# Patient Record
Sex: Female | Born: 1955 | Race: Black or African American | Hispanic: No | Marital: Single | Smoking: Former smoker
Health system: Southern US, Community
[De-identification: ages and names within clinical notes are randomized; demographics above are authoritative.]

## PROBLEM LIST (undated history)

## (undated) DIAGNOSIS — Z720 Tobacco use: Secondary | ICD-10-CM

## (undated) DIAGNOSIS — J189 Pneumonia, unspecified organism: Secondary | ICD-10-CM

## (undated) DIAGNOSIS — E119 Type 2 diabetes mellitus without complications: Secondary | ICD-10-CM

## (undated) DIAGNOSIS — J449 Chronic obstructive pulmonary disease, unspecified: Secondary | ICD-10-CM

## (undated) DIAGNOSIS — I10 Essential (primary) hypertension: Secondary | ICD-10-CM

---

## 2000-09-08 ENCOUNTER — Other Ambulatory Visit: Admission: RE | Admit: 2000-09-08 | Discharge: 2000-09-08 | Payer: Self-pay | Admitting: Obstetrics and Gynecology

## 2000-09-19 ENCOUNTER — Ambulatory Visit (HOSPITAL_COMMUNITY): Admission: RE | Admit: 2000-09-19 | Discharge: 2000-09-19 | Payer: Self-pay | Admitting: *Deleted

## 2000-09-19 ENCOUNTER — Encounter: Payer: Self-pay | Admitting: Obstetrics and Gynecology

## 2000-11-05 ENCOUNTER — Emergency Department (HOSPITAL_COMMUNITY): Admission: EM | Admit: 2000-11-05 | Discharge: 2000-11-06 | Payer: Self-pay | Admitting: *Deleted

## 2000-11-06 ENCOUNTER — Encounter: Payer: Self-pay | Admitting: *Deleted

## 2000-11-06 ENCOUNTER — Ambulatory Visit (HOSPITAL_COMMUNITY): Admission: RE | Admit: 2000-11-06 | Discharge: 2000-11-06 | Payer: Self-pay | Admitting: *Deleted

## 2000-11-14 ENCOUNTER — Ambulatory Visit (HOSPITAL_COMMUNITY): Admission: RE | Admit: 2000-11-14 | Discharge: 2000-11-14 | Payer: Self-pay | Admitting: Family Medicine

## 2000-11-14 ENCOUNTER — Encounter: Payer: Self-pay | Admitting: Family Medicine

## 2002-05-25 ENCOUNTER — Ambulatory Visit (HOSPITAL_COMMUNITY): Admission: RE | Admit: 2002-05-25 | Discharge: 2002-05-25 | Payer: Self-pay | Admitting: General Surgery

## 2003-12-16 ENCOUNTER — Ambulatory Visit (HOSPITAL_COMMUNITY): Admission: RE | Admit: 2003-12-16 | Discharge: 2003-12-16 | Payer: Self-pay | Admitting: Obstetrics and Gynecology

## 2005-02-11 ENCOUNTER — Ambulatory Visit (HOSPITAL_COMMUNITY): Admission: RE | Admit: 2005-02-11 | Discharge: 2005-02-11 | Payer: Self-pay | Admitting: Obstetrics and Gynecology

## 2006-03-24 ENCOUNTER — Ambulatory Visit (HOSPITAL_COMMUNITY): Admission: RE | Admit: 2006-03-24 | Discharge: 2006-03-24 | Payer: Self-pay | Admitting: Family Medicine

## 2007-01-18 ENCOUNTER — Emergency Department (HOSPITAL_COMMUNITY): Admission: EM | Admit: 2007-01-18 | Discharge: 2007-01-18 | Payer: Self-pay | Admitting: Emergency Medicine

## 2007-01-20 ENCOUNTER — Emergency Department (HOSPITAL_COMMUNITY): Admission: EM | Admit: 2007-01-20 | Discharge: 2007-01-20 | Payer: Self-pay | Admitting: Emergency Medicine

## 2007-04-02 ENCOUNTER — Emergency Department (HOSPITAL_COMMUNITY): Admission: EM | Admit: 2007-04-02 | Discharge: 2007-04-02 | Payer: Self-pay | Admitting: Emergency Medicine

## 2009-07-25 ENCOUNTER — Ambulatory Visit (HOSPITAL_COMMUNITY): Admission: RE | Admit: 2009-07-25 | Discharge: 2009-07-25 | Payer: Self-pay | Admitting: Obstetrics & Gynecology

## 2010-10-12 NOTE — H&P (Signed)
   Rita Jackson, Rita Jackson                         ACCOUNT NO.:  [id number]   MEDICAL RECORD NO.:  [id number]                   PATIENT TYPE:   LOCATION:                                       FACILITY:  [facility]   PHYSICIAN:  Dirk Dress. Katrinka Blazing, M.D.                DATE OF BIRTH:   DATE OF ADMISSION:  DATE OF DISCHARGE:                                HISTORY & PHYSICAL   HISTORY OF PRESENT ILLNESS:  [age]-year-old female with a history of a  mass of her left hip that has been present for about eight months.  The mass  is gradually increasing in size.  It is not painful.  She has some  discomfort with sitting.  There has been no change in color of the mass.  The patient is scheduled for have the mass removed in the minor procedure  room at the hospital.   PAST HISTORY:  The patient has no chronic medical illnesses.   PAST SURGICAL HISTORY:  The patient has had no surgeries.  No  hospitalizations.   ALLERGIES:  No known drug allergies.   SOCIAL HISTORY:  The patient does not drink, smoke or use drugs.   REVIEW OF SYSTEMS:  Review of systems is entirely negative.   PHYSICAL EXAMINATION:  VITAL SIGNS:  Blood pressure 120/80, pulse 64,  respirations 18 and weight 155 pounds.  HEENT:  No abnormality noted.  NECK:  Supple without JVD or bruits.  CHEST:  Clear to auscultation.  HEART:  Regular rate and rhythm without murmur, gallop or rub.  ABDOMEN:  Soft, nontender and no masses.  EXTREMITIES:  No cyanosis, clubbing or edema.  BACK:  Reveals a 4 cm mass of the left upper buttock and lower back.  It is  slightly fixed and probably inflammatory.  There is some skin dimpling over  the mass.  No other lesions are noted.  NEUROLOGIC EXAMINATION:  Nonfocal.   IMPRESSION:  Soft tissue mass, left upper buttock.   PLAN:  Excision under local anesthesia in the minor procedure area.                                                 Dirk Dress. Katrinka Blazing, M.D.   LCS/MEDQ  D:  05/25/2002  T:   05/25/2002  Job:  [id number]

## 2010-10-12 NOTE — Op Note (Signed)
   NAME:  Rita Jackson, Rita Jackson                       ACCOUNT NO.:  [id number]   MEDICAL RECORD NO.:  [id number]                   PATIENT TYPE:  AMB   LOCATION:  DAY                                  FACILITY:  [facility]   PHYSICIAN:  Jerolyn Shin C. Katrinka Blazing, M.D.                DATE OF BIRTH:  May 23, 1956   DATE OF PROCEDURE:  DATE OF DISCHARGE:                                 OPERATIVE REPORT   PREOPERATIVE DIAGNOSIS:  Mass left buttock.   POSTOPERATIVE DIAGNOSIS:  Mass left buttock, 5 cm.   PROCEDURE:  Excision of mass left buttock, 5 cm.   SURGEON:  Dirk Dress. Katrinka Blazing, M.D.   DESCRIPTION OF PROCEDURE:  The patient was taken to the minor procedure room  in Day Surgery.  Her left buttock was prepped and draped in a sterile field.  Local infiltration with 1% Xylocaine mixed 1:1 with 0.5% Marcaine was  carried out.  An elliptical incision was made over the mass. The skin  overlying the mass was excised.  A soft tissue, pliable, dark, mass was  encountered.  It was dissected with electrocautery and Metzenbaum scissors.  It was totally dissected and sent for pathologic evaluation.   The deep tissues were closed with 2-0 Biosyn.  Subcutaneous tissue closed  with 3-0 Biosyn.  Skin closed with 3-0 Prolene.  Sterile dressing was  placed.  The patient tolerated the procedure.  She was allowed to dress and  was taken back to Day Surgery area for discharge home.   DISCHARGE MEDICATIONS:  Lortab 7.5 mg 1-2 q.4h. as needed for pain.   DISCHARGE INSTRUCTIONS:  I will see her back in the office in 1 week.                                               Dirk Dress. Katrinka Blazing, M.D.    LCS/MEDQ  D:  05/25/2002  T:  05/25/2002  Job:  [id number]

## 2011-07-30 ENCOUNTER — Emergency Department (HOSPITAL_COMMUNITY)
Admission: EM | Admit: 2011-07-30 | Discharge: 2011-07-30 | Disposition: A | Discharge status: Home Health | Payer: BC Managed Care – PPO | Attending: Emergency Medicine | Admitting: Emergency Medicine

## 2011-07-30 ENCOUNTER — Emergency Department (HOSPITAL_COMMUNITY): Payer: BC Managed Care – PPO

## 2011-07-30 ENCOUNTER — Encounter (HOSPITAL_COMMUNITY): Payer: Self-pay | Admitting: *Deleted

## 2011-07-30 DIAGNOSIS — M549 Dorsalgia, unspecified: Secondary | ICD-10-CM | POA: Insufficient documentation

## 2011-07-30 DIAGNOSIS — R221 Localized swelling, mass and lump, neck: Secondary | ICD-10-CM | POA: Insufficient documentation

## 2011-07-30 DIAGNOSIS — R5383 Other fatigue: Secondary | ICD-10-CM | POA: Insufficient documentation

## 2011-07-30 DIAGNOSIS — R51 Headache: Secondary | ICD-10-CM | POA: Insufficient documentation

## 2011-07-30 DIAGNOSIS — R059 Cough, unspecified: Secondary | ICD-10-CM | POA: Insufficient documentation

## 2011-07-30 DIAGNOSIS — R079 Chest pain, unspecified: Secondary | ICD-10-CM | POA: Insufficient documentation

## 2011-07-30 DIAGNOSIS — J3489 Other specified disorders of nose and nasal sinuses: Secondary | ICD-10-CM | POA: Insufficient documentation

## 2011-07-30 DIAGNOSIS — IMO0001 Reserved for inherently not codable concepts without codable children: Secondary | ICD-10-CM | POA: Insufficient documentation

## 2011-07-30 DIAGNOSIS — J40 Bronchitis, not specified as acute or chronic: Secondary | ICD-10-CM | POA: Insufficient documentation

## 2011-07-30 DIAGNOSIS — R5381 Other malaise: Secondary | ICD-10-CM | POA: Insufficient documentation

## 2011-07-30 DIAGNOSIS — R05 Cough: Secondary | ICD-10-CM | POA: Insufficient documentation

## 2011-07-30 DIAGNOSIS — R220 Localized swelling, mass and lump, head: Secondary | ICD-10-CM | POA: Insufficient documentation

## 2011-07-30 DIAGNOSIS — F17200 Nicotine dependence, unspecified, uncomplicated: Secondary | ICD-10-CM | POA: Insufficient documentation

## 2011-07-30 DIAGNOSIS — R062 Wheezing: Secondary | ICD-10-CM | POA: Insufficient documentation

## 2011-07-30 MED ORDER — ALBUTEROL SULFATE HFA 108 (90 BASE) MCG/ACT IN AERS
2.0000 | INHALATION_SPRAY | Freq: Once | RESPIRATORY_TRACT | Status: AC
  Administered 2011-07-30: 2 via RESPIRATORY_TRACT
  Filled 2011-07-30: qty 6.7

## 2011-07-30 MED ORDER — AZITHROMYCIN 250 MG PO TABS: ORAL_TABLET | ORAL | Status: DC

## 2011-07-30 MED ORDER — NAPROXEN 500 MG PO TABS: 500.0000 mg | ORAL_TABLET | Freq: Two times a day (BID) | ORAL | Status: AC

## 2011-07-30 NOTE — ED Notes (Signed)
Pt Dc to home with steady gait. 

## 2011-07-30 NOTE — ED Provider Notes (Signed)
History     CSN: [id number]  Arrival date & time 07/30/11  1205   First MD Initiated Contact with Patient 07/30/11 1243      Chief Complaint  Patient presents with  . Back Pain    (Consider location/radiation/quality/duration/timing/severity/associated sxs/prior treatment) HPI Comments: Patient complains of productive cough, nasal congestion, upper back pain and generalized weakness for several days.  She states the upper back pain is worse with excessive coughing.  She denies shortness of breath, chest pain, or fever. She does report occasional wheezing  Patient is a 56 y.o. female presenting with cough. The history is provided by the patient. No language interpreter was used.  Cough This is a new problem. The current episode started more than 2 days ago. The problem occurs every few minutes. The problem has not changed since onset.The cough is productive of sputum. There has been no fever. Associated symptoms include headaches, rhinorrhea, sore throat and wheezing. Pertinent negatives include no chest pain, no chills, no sweats, no ear congestion, no myalgias and no shortness of breath. She has tried nothing for the symptoms. The treatment provided mild relief. She is a smoker. Her past medical history is significant for bronchitis. Her past medical history does not include pneumonia or asthma.    History reviewed. No pertinent past medical history.  History reviewed. No pertinent past surgical history.  History reviewed. No pertinent family history.  History  Substance Use Topics  . Smoking status: Current Everyday Smoker  . Smokeless tobacco: Not on file  . Alcohol Use: No    OB History    Grav Para Term Preterm Abortions TAB SAB Ect Mult Living                  Review of Systems  Constitutional: Negative for fever and chills.  HENT: Positive for congestion, sore throat and rhinorrhea. Negative for trouble swallowing, neck pain and neck stiffness.   Respiratory:  Positive for cough and wheezing. Negative for chest tightness and shortness of breath.   Cardiovascular: Negative for chest pain, palpitations and leg swelling.  Gastrointestinal: Negative for nausea, vomiting and abdominal pain.  Genitourinary: Negative for dysuria, hematuria and flank pain.  Musculoskeletal: Positive for back pain. Negative for myalgias and arthralgias.  Skin: Negative.  Negative for rash.  Neurological: Positive for headaches. Negative for dizziness, syncope, weakness and numbness.  Hematological: Does not bruise/bleed easily.  All other systems reviewed and are negative.    Allergies  Review of patient's allergies indicates no known allergies.  Home Medications  No current outpatient prescriptions on file.  BP 115/64  Pulse 83  Temp(Src) 99 F (37.2 C) (Oral)  Resp 20  Ht 5\' 7"  (1.702 m)  Wt 160 lb (72.576 kg)  BMI 25.06 kg/m2  SpO2 98%  Physical Exam  Nursing note and vitals reviewed. Constitutional: She is oriented to person, place, and time. She appears well-developed and well-nourished. No distress.  HENT:  Head: Normocephalic and atraumatic. No trismus in the jaw.  Right Ear: Tympanic membrane and ear canal normal.  Left Ear: Tympanic membrane and ear canal normal.  Nose: Mucosal edema and rhinorrhea present.  Mouth/Throat: Uvula is midline and mucous membranes are normal. No uvula swelling. Posterior oropharyngeal erythema present. No oropharyngeal exudate, posterior oropharyngeal edema or tonsillar abscesses.  Neck: Normal range of motion. Neck supple. No Brudzinski's sign and no Kernig's sign noted.  Cardiovascular: Normal rate, regular rhythm, normal heart sounds and intact distal pulses.   No murmur heard. Pulmonary/Chest:  Effort normal. No respiratory distress. She has no wheezes. She has no rales. She exhibits no tenderness.       Coarse lung sounds bilaterally  Abdominal: Soft. She exhibits no distension. There is no tenderness.    Musculoskeletal: Normal range of motion. She exhibits tenderness. She exhibits no edema.  Lymphadenopathy:    She has no cervical adenopathy.  Neurological: She is alert and oriented to person, place, and time. She exhibits normal muscle tone. Coordination normal.  Skin: Skin is warm and dry.    ED Course  Procedures (including critical care time)  Labs Reviewed - No data to display Dg Chest 2 View  07/30/2011  *RADIOLOGY REPORT*  Clinical Data: Left posterior chest and back pain  CHEST - 2 VIEW  Comparison: None.  Findings: No active infiltrate or effusion is seen.  The lungs are slightly hyperaerated.  There is peribronchial thickening as well, findings consistent with mild COPD and probable chronic bronchitis. Mild cardiomegaly is noted.  No effusion is seen.  No bony abnormality is noted.  IMPRESSION: Probable COPD and chronic bronchitis.  No active process.  Mild cardiomegaly.  Original Report Authenticated By: Juline Patch, M.D.        MDM     Patient is resting comfortably no acute distress. She is nontoxic appearing, ambulates well. Few coarse lung sounds bilaterally no rales or wheezing.  Vital signs are stable, no hypoxia tachypnea or tachycardia to suggest PE. No chest pain, dyspnea, or hypotension.   I have counseled the patient on smoking cessation, I will prescribe antibiotics and albuterol inhaler. She agrees to close followup with her primary care physician Dr.Hill or to return to the ER for any worsening symptoms.  Patient / Family / Caregiver understand and agree with initial ED impression and plan with expectations set for ED visit. Pt stable in ED with no significant deterioration in condition. Pt feels improved after observation and/or treatment in ED.     Kendal Raffo L. [place], [place] 08/01/11 1810

## 2011-07-30 NOTE — Discharge Instructions (Signed)

## 2011-07-30 NOTE — ED Notes (Signed)
Upper back pain ,and feels weak,  Cough, headache

## 2011-08-03 NOTE — ED Provider Notes (Signed)
Medical screening examination/treatment/procedure(s) were performed by non-physician practitioner and as supervising physician I was immediately available for consultation/collaboration.   Berneice Zettlemoyer M Lamiracle Chaidez, DO 08/03/11 0714 

## 2013-02-25 ENCOUNTER — Encounter: Payer: Self-pay | Admitting: *Deleted

## 2013-02-25 ENCOUNTER — Other Ambulatory Visit: Payer: Self-pay | Admitting: Obstetrics & Gynecology

## 2013-06-21 ENCOUNTER — Other Ambulatory Visit (HOSPITAL_COMMUNITY): Payer: Self-pay | Admitting: Family Medicine

## 2013-06-21 DIAGNOSIS — Z139 Encounter for screening, unspecified: Secondary | ICD-10-CM

## 2013-06-24 ENCOUNTER — Ambulatory Visit (HOSPITAL_COMMUNITY)
Admission: RE | Admit: 2013-06-24 | Discharge: 2013-06-24 | Disposition: A | Discharge status: Home / Self Care | Payer: BC Managed Care – PPO | Source: Ambulatory Visit | Attending: Family Medicine | Admitting: Family Medicine

## 2013-06-24 DIAGNOSIS — Z1231 Encounter for screening mammogram for malignant neoplasm of breast: Secondary | ICD-10-CM | POA: Insufficient documentation

## 2013-06-24 DIAGNOSIS — Z139 Encounter for screening, unspecified: Secondary | ICD-10-CM

## 2013-07-02 ENCOUNTER — Other Ambulatory Visit: Payer: Self-pay | Admitting: Obstetrics & Gynecology

## 2013-07-10 ENCOUNTER — Emergency Department (INDEPENDENT_AMBULATORY_CARE_PROVIDER_SITE_OTHER): Payer: BC Managed Care – PPO

## 2013-07-10 ENCOUNTER — Emergency Department (HOSPITAL_COMMUNITY)
Admission: EM | Admit: 2013-07-10 | Discharge: 2013-07-10 | Disposition: A | Discharge status: Home / Self Care | Payer: BC Managed Care – PPO | Source: Home / Self Care | Attending: Family Medicine | Admitting: Family Medicine

## 2013-07-10 ENCOUNTER — Encounter (HOSPITAL_COMMUNITY): Payer: Self-pay | Admitting: Emergency Medicine

## 2013-07-10 DIAGNOSIS — J209 Acute bronchitis, unspecified: Secondary | ICD-10-CM

## 2013-07-10 DIAGNOSIS — J440 Chronic obstructive pulmonary disease with acute lower respiratory infection: Secondary | ICD-10-CM

## 2013-07-10 MED ORDER — DEXTROMETHORPHAN POLISTIREX 30 MG/5ML PO LQCR: 60.0000 mg | Freq: Two times a day (BID) | ORAL | Status: DC

## 2013-07-10 MED ORDER — LEVOFLOXACIN 500 MG PO TABS: 500.0000 mg | ORAL_TABLET | Freq: Every day | ORAL | Status: DC

## 2013-07-10 NOTE — ED Notes (Signed)
C/o cough , St for 1 week. Smokes 1-2 pk /day

## 2013-07-10 NOTE — ED Provider Notes (Signed)
CSN: [id number]     Arrival date & time 07/10/13  1230 History   First MD Initiated Contact with Patient 07/10/13 1310     Chief Complaint  Patient presents with  . Cough     (Consider location/radiation/quality/duration/timing/severity/associated sxs/prior Treatment) Patient is a 58 y.o. female presenting with cough. The history is provided by the patient.  Cough Cough characteristics:  Productive and harsh Sputum characteristics:  Green Severity:  Moderate Onset quality:  Sudden Duration:  1 week Progression:  Unchanged Chronicity:  New Smoker: yes   Associated symptoms: rhinorrhea and shortness of breath   Associated symptoms: no fever     History reviewed. No pertinent past medical history. History reviewed. No pertinent past surgical history. History reviewed. No pertinent family history. History  Substance Use Topics  . Smoking status: Current Every Day Smoker  . Smokeless tobacco: Not on file  . Alcohol Use: No   OB History   Grav Para Term Preterm Abortions TAB SAB Ect Mult Living                 Review of Systems  Constitutional: Negative for fever.  HENT: Positive for congestion, postnasal drip and rhinorrhea.   Respiratory: Positive for cough and shortness of breath.   Gastrointestinal: Negative.       Allergies  Review of patient's allergies indicates no known allergies.  Home Medications   Current Outpatient Rx  Name  Route  Sig  Dispense  Refill  . azithromycin (ZITHROMAX Z-PAK) 250 MG tablet      Take two tablets on day one, then one tab qd days 2-5   6 tablet   0   . dextromethorphan (DELSYM) 30 MG/5ML liquid   Oral   Take 10 mLs (60 mg total) by mouth 2 (two) times daily. For cough   89 mL   0   . levofloxacin (LEVAQUIN) 500 MG tablet   Oral   Take 1 tablet (500 mg total) by mouth daily.   7 tablet   1    BP 176/67  Pulse 60  Temp(Src) 98.1 F (36.7 C) (Oral)  Resp 20  SpO2 95% Physical Exam  Nursing note and vitals  reviewed. Constitutional: She is oriented to person, place, and time. She appears well-developed and well-nourished. No distress.  HENT:  Head: Normocephalic.  Right Ear: External ear normal.  Left Ear: External ear normal.  Mouth/Throat: Oropharynx is clear and moist.  Eyes: Pupils are equal, round, and reactive to light.  Neck: Normal range of motion. Neck supple.  Cardiovascular: Normal rate, normal heart sounds and intact distal pulses.   Pulmonary/Chest: She has decreased breath sounds. She has rhonchi.  Abdominal: Soft. Bowel sounds are normal.  Lymphadenopathy:    She has no cervical adenopathy.  Neurological: She is alert and oriented to person, place, and time.  Skin: Skin is warm and dry.    ED Course  Procedures (including critical care time) Labs Review Labs Reviewed - No data to display Imaging Review Dg Chest 2 View  07/10/2013   CLINICAL DATA:  Cough, shortness of breath.  EXAM: CHEST  2 VIEW  COMPARISON:  July 30, 2011.  FINDINGS: Stable mild cardiomegaly. No pleural effusion or pneumothorax is noted. Stable peribronchial thickening is noted. Mild hyperexpansion of the lungs is noted suggesting chronic obstructive pulmonary disease. No acute pulmonary disease is noted.  IMPRESSION: Findings consistent with chronic obstructive pulmonary disease. No acute cardiopulmonary abnormality seen.   Electronically Signed   By: Fayrene FearingJames  Green M.D.   On: 07/10/2013 14:14   X-rays reviewed and report per radiologist.    MDM   Final diagnoses:  COPD (chronic obstructive pulmonary disease) with acute bronchitis        Linna Hoff, MD 07/10/13 1432

## 2013-07-10 NOTE — Discharge Instructions (Signed)
Take all of medicine, drink lots of fluids, no more smoking, see your doctor if further problems  °

## 2013-07-15 ENCOUNTER — Other Ambulatory Visit: Payer: Self-pay | Admitting: Obstetrics & Gynecology

## 2017-06-23 ENCOUNTER — Encounter (HOSPITAL_COMMUNITY): Payer: Self-pay | Admitting: Emergency Medicine

## 2017-06-23 ENCOUNTER — Emergency Department (HOSPITAL_COMMUNITY): Payer: BLUE CROSS/BLUE SHIELD

## 2017-06-23 ENCOUNTER — Inpatient Hospital Stay (HOSPITAL_COMMUNITY): Payer: BLUE CROSS/BLUE SHIELD

## 2017-06-23 ENCOUNTER — Other Ambulatory Visit: Payer: Self-pay

## 2017-06-23 ENCOUNTER — Inpatient Hospital Stay (HOSPITAL_COMMUNITY)
Admission: EM | Admit: 2017-06-23 | Discharge: 2017-06-26 | DRG: 193 | Disposition: A | Discharge status: Home / Self Care | Payer: BLUE CROSS/BLUE SHIELD | Attending: Family Medicine | Admitting: Family Medicine

## 2017-06-23 DIAGNOSIS — J9621 Acute and chronic respiratory failure with hypoxia: Secondary | ICD-10-CM | POA: Diagnosis not present

## 2017-06-23 DIAGNOSIS — R0902 Hypoxemia: Secondary | ICD-10-CM

## 2017-06-23 DIAGNOSIS — Z720 Tobacco use: Secondary | ICD-10-CM | POA: Diagnosis not present

## 2017-06-23 DIAGNOSIS — R05 Cough: Secondary | ICD-10-CM | POA: Diagnosis not present

## 2017-06-23 DIAGNOSIS — J181 Lobar pneumonia, unspecified organism: Principal | ICD-10-CM

## 2017-06-23 DIAGNOSIS — J449 Chronic obstructive pulmonary disease, unspecified: Secondary | ICD-10-CM | POA: Diagnosis not present

## 2017-06-23 DIAGNOSIS — R079 Chest pain, unspecified: Secondary | ICD-10-CM | POA: Diagnosis not present

## 2017-06-23 DIAGNOSIS — F17210 Nicotine dependence, cigarettes, uncomplicated: Secondary | ICD-10-CM | POA: Diagnosis present

## 2017-06-23 DIAGNOSIS — J440 Chronic obstructive pulmonary disease with acute lower respiratory infection: Secondary | ICD-10-CM | POA: Diagnosis present

## 2017-06-23 DIAGNOSIS — J189 Pneumonia, unspecified organism: Secondary | ICD-10-CM | POA: Diagnosis not present

## 2017-06-23 LAB — BASIC METABOLIC PANEL
ANION GAP: 14 (ref 5–15)
BUN: 24 mg/dL — ABNORMAL HIGH (ref 6–20)
CALCIUM: 8.3 mg/dL — AB (ref 8.9–10.3)
CO2: 23 mmol/L (ref 22–32)
Chloride: 99 mmol/L — ABNORMAL LOW (ref 101–111)
Creatinine, Ser: 1.1 mg/dL — ABNORMAL HIGH (ref 0.44–1.00)
GFR, EST NON AFRICAN AMERICAN: 54 mL/min — AB (ref >60)
GLUCOSE: 160 mg/dL — AB (ref 65–99)
Potassium: 3.7 mmol/L (ref 3.5–5.1)
Sodium: 136 mmol/L (ref 135–145)

## 2017-06-23 LAB — INFLUENZA PANEL BY PCR (TYPE A & B)
INFLAPCR: NEGATIVE
INFLBPCR: NEGATIVE

## 2017-06-23 LAB — CBC WITH DIFFERENTIAL/PLATELET
BASOS ABS: 0 10*3/uL (ref 0.0–0.1)
BASOS PCT: 0 %
Eosinophils Absolute: 0 10*3/uL (ref 0.0–0.7)
Eosinophils Relative: 0 %
HCT: 43.5 % (ref 36.0–46.0)
Hemoglobin: 13.8 g/dL (ref 12.0–15.0)
Lymphocytes Relative: 10 %
Lymphs Abs: 1.2 10*3/uL (ref 0.7–4.0)
MCH: 28.2 pg (ref 26.0–34.0)
MCHC: 31.7 g/dL (ref 30.0–36.0)
MCV: 89 fL (ref 78.0–100.0)
Monocytes Absolute: 1.5 10*3/uL — ABNORMAL HIGH (ref 0.1–1.0)
Monocytes Relative: 13 %
NEUTROS ABS: 8.8 10*3/uL — AB (ref 1.7–7.7)
Neutrophils Relative %: 77 %
PLATELETS: 194 10*3/uL (ref 150–400)
RBC: 4.89 MIL/uL (ref 3.87–5.11)
RDW: 14.3 % (ref 11.5–15.5)
WBC: 11.4 10*3/uL — AB (ref 4.0–10.5)

## 2017-06-23 LAB — TROPONIN I: Troponin I: <0.03 ng/mL (ref <0.03)

## 2017-06-23 MED ORDER — ACETAMINOPHEN 650 MG RE SUPP
650.0000 mg | Freq: Four times a day (QID) | RECTAL | Status: DC | PRN
Start: 2017-06-23 — End: 2017-06-26

## 2017-06-23 MED ORDER — DEXTROSE 5 % IV SOLN
500.0000 mg | INTRAVENOUS | Status: DC
  Administered 2017-06-24: 500 mg via INTRAVENOUS
  Filled 2017-06-23 (×3): qty 500

## 2017-06-23 MED ORDER — DEXTROSE 5 % IV SOLN
1.0000 g | INTRAVENOUS | Status: DC
  Administered 2017-06-24 – 2017-06-25 (×2): 1 g via INTRAVENOUS
  Filled 2017-06-23 (×3): qty 10

## 2017-06-23 MED ORDER — DEXTROSE 5 % IV SOLN
1.0000 g | INTRAVENOUS | Status: DC
  Filled 2017-06-23: qty 10

## 2017-06-23 MED ORDER — DEXTROSE 5 % IV SOLN
500.0000 mg | INTRAVENOUS | Status: DC
  Filled 2017-06-23: qty 500

## 2017-06-23 MED ORDER — IOPAMIDOL (ISOVUE-370) INJECTION 76%
100.0000 mL | Freq: Once | INTRAVENOUS | Status: AC | PRN
  Administered 2017-06-23: 100 mL via INTRAVENOUS

## 2017-06-23 MED ORDER — IPRATROPIUM-ALBUTEROL 0.5-2.5 (3) MG/3ML IN SOLN
3.0000 mL | Freq: Once | RESPIRATORY_TRACT | Status: AC
  Administered 2017-06-23: 3 mL via RESPIRATORY_TRACT
  Filled 2017-06-23: qty 3

## 2017-06-23 MED ORDER — DEXTROSE 5 % IV SOLN
1.0000 g | Freq: Once | INTRAVENOUS | Status: AC
  Administered 2017-06-23: 1 g via INTRAVENOUS
  Filled 2017-06-23: qty 10

## 2017-06-23 MED ORDER — DEXTROSE 5 % IV SOLN
500.0000 mg | Freq: Once | INTRAVENOUS | Status: AC
  Administered 2017-06-23: 500 mg via INTRAVENOUS
  Filled 2017-06-23: qty 500

## 2017-06-23 MED ORDER — SENNOSIDES-DOCUSATE SODIUM 8.6-50 MG PO TABS: 1.0000 | ORAL_TABLET | Freq: Every evening | ORAL | Status: DC | PRN

## 2017-06-23 MED ORDER — ONDANSETRON HCL 4 MG/2ML IJ SOLN: 4.0000 mg | Freq: Four times a day (QID) | INTRAMUSCULAR | Status: DC | PRN

## 2017-06-23 MED ORDER — ONDANSETRON HCL 4 MG PO TABS: 4.0000 mg | ORAL_TABLET | Freq: Four times a day (QID) | ORAL | Status: DC | PRN

## 2017-06-23 MED ORDER — SODIUM CHLORIDE 0.9 % IV SOLN
INTRAVENOUS | Status: DC
  Administered 2017-06-23 – 2017-06-25 (×4): via INTRAVENOUS

## 2017-06-23 MED ORDER — ENOXAPARIN SODIUM 40 MG/0.4ML ~~LOC~~ SOLN
40.0000 mg | SUBCUTANEOUS | Status: DC
  Administered 2017-06-23 – 2017-06-25 (×3): 40 mg via SUBCUTANEOUS
  Filled 2017-06-23 (×3): qty 0.4

## 2017-06-23 MED ORDER — ACETAMINOPHEN 325 MG PO TABS: 650.0000 mg | ORAL_TABLET | Freq: Four times a day (QID) | ORAL | Status: DC | PRN

## 2017-06-23 NOTE — ED Triage Notes (Signed)
Cough congestion for three days. Denies fever. Positive for bodyaches. Hurts with breathing on right side of chest.

## 2017-06-23 NOTE — ED Notes (Signed)
Pt placed on 3L [place] 02

## 2017-06-23 NOTE — Progress Notes (Signed)
Pt's Flu came back negative for both A+B so I discontinued droplet. Pt's CXR showed pneumonia.

## 2017-06-23 NOTE — ED Provider Notes (Signed)
Emergency Department Provider Note   I have reviewed the triage vital signs and the nursing notes.   HISTORY  Chief Complaint Cough   HPI Rita Jackson is a 62 y.o. female with PMH of COPD and smoking history since to the emergency department with 3 days of cough and congestion.  She has pain in the right side of her chest that is significantly worse with coughing.  She does have mild chest discomfort at rest.  She denies any exertional or pleuritic discomfort.  She has had some body aches but denies fevers or shaking chills.  No known sick contacts.  Drove herself to the emergency department today after waking up this morning and feeling slightly worse.  Denies vomiting or diarrhea.  She does not use inhalers or other COPD medications at home.  She is not on home oxygen.   History reviewed. No pertinent past medical history.  Patient Active Problem List   Diagnosis Date Noted  . CAP (community acquired pneumonia) 06/23/2017    History reviewed. No pertinent surgical history.    Allergies Patient has no known allergies.  History reviewed. No pertinent family history.  Social History Social History   Tobacco Use  . Smoking status: Current Every Day Smoker    Packs/day: 1.00    Types: Cigarettes  . Smokeless tobacco: Never Used  Substance Use Topics  . Alcohol use: No  . Drug use: No    Review of Systems  Constitutional: Positive fever/chills and fatigue. Positive body aches.  Eyes: No visual changes. ENT: No sore throat. Cardiovascular: Positive chest pain mostly with coughing.  Respiratory: Mild shortness of breath and cough.  Gastrointestinal: No abdominal pain.  No nausea, no vomiting.  No diarrhea.  No constipation. Genitourinary: Negative for dysuria. Musculoskeletal: Negative for back pain. Skin: Negative for rash. Neurological: Negative for focal weakness or numbness. Positive mild HA.   10-point ROS otherwise  negative.  ____________________________________________   PHYSICAL EXAM:  VITAL SIGNS: ED Triage Vitals  Enc Vitals Group     BP 06/23/17 0945 137/73     Pulse Rate 06/23/17 0945 (!) 108     Resp 06/23/17 1000 (!) 33     Temp 06/23/17 0945 98.9 F (37.2 C)     Temp Source 06/23/17 0945 Oral     SpO2 06/23/17 0950 (!) 72 %     Weight 06/23/17 0947 164 lb (74.4 kg)     Height 06/23/17 0947 5\' 6"  (1.676 m)     Pain Score 06/23/17 0947 7    Constitutional: Alert and oriented. Well appearing and in no acute distress. Eyes: Conjunctivae are normal. Head: Atraumatic. Nose: No congestion/rhinnorhea. Mouth/Throat: Mucous membranes are moist.  Oropharynx non-erythematous. Neck: No stridor.  Cardiovascular: Tachycardia. Good peripheral circulation. Grossly normal heart sounds.   Respiratory: Increased respiratory effort.  No retractions. Lungs CTAB. No wheezing.  Gastrointestinal: Soft and nontender. No distention.  Musculoskeletal: No lower extremity tenderness nor edema. No gross deformities of extremities. Neurologic:  Normal speech and language. No gross focal neurologic deficits are appreciated.  Skin:  Skin is warm, dry and intact. No rash noted.   ____________________________________________   LABS (all labs ordered are listed, but only abnormal results are displayed)  Labs Reviewed  CBC WITH DIFFERENTIAL/PLATELET - Abnormal; Notable for the following components:      Result Value   WBC 11.4 (*)    Neutro Abs 8.8 (*)    Monocytes Absolute 1.5 (*)    All other components  within normal limits  BASIC METABOLIC PANEL - Abnormal; Notable for the following components:   Chloride 99 (*)    Glucose, Bld 160 (*)    BUN 24 (*)    Creatinine, Ser 1.10 (*)    Calcium 8.3 (*)    GFR calc non Af Amer 54 (*)    All other components within normal limits  CULTURE, BLOOD (ROUTINE X 2)  CULTURE, BLOOD (ROUTINE X 2)  TROPONIN I  INFLUENZA PANEL BY PCR (TYPE A & B)    ____________________________________________  EKG   EKG Interpretation  Date/Time:  Monday June 23 2017 10:01:50 EST Ventricular Rate:  103 PR Interval:    QRS Duration: 84 QT Interval:  348 QTC Calculation: 456 R Axis:   66 Text Interpretation:  Sinus tachycardia LAE, consider biatrial enlargement No STEMI.  Confirmed by Alona BeneLong, [place] [phone] on 06/23/2017 10:06:40 AM       ____________________________________________  RADIOLOGY  Dg Chest 2 View  Result Date: 06/23/2017 CLINICAL DATA:  Cough and congestion x3 days with body aches. Patient states right sided chest pain with coughing and breathing. Current smoker. EXAM: CHEST  2 VIEW COMPARISON:  Normal cardiac silhouette. There is segmental airspace disease in the RIGHT upper lobe. More diffuse airspace disease in the RIGHT lower lobe. Obscuration of the heart border on lateral projection. No acute osseous abnormality. FINDINGS: The heart size and mediastinal contours are within normal limits. Both lungs are clear. The visualized skeletal structures are unremarkable. IMPRESSION: 1. RIGHT upper lobe pneumonia. 2. Potential RIGHT middle lobe pneumonia. 3. Followup PA and lateral chest X-ray is recommended in 3-4 weeks following trial of antibiotic therapy to ensure resolution and exclude underlying malignancy. Electronically Signed   By: Genevive BiStewart  Edmunds M.D.   On: 06/23/2017 11:02    ____________________________________________   PROCEDURES  Procedure(s) performed:   .Critical Care Performed by: Maia PlanLong, Gerald Kuehl G, MD Authorized by: Maia PlanLong, Maurina Fawaz G, MD   Critical care provider statement:    Critical care time (minutes):  35   Critical care time was exclusive of:  Separately billable procedures and treating other patients and teaching time   Critical care was necessary to treat or prevent imminent or life-threatening deterioration of the following conditions:  Respiratory failure   Critical care was time spent personally by me  on the following activities:  Blood draw for specimens, development of treatment plan with patient or surrogate, discussions with primary provider, evaluation of patient's response to treatment, examination of patient, ordering and performing treatments and interventions, ordering and review of laboratory studies, ordering and review of radiographic studies, pulse oximetry, re-evaluation of patient's condition and review of old charts   I assumed direction of critical care for this patient from another provider in my specialty: no       ____________________________________________   INITIAL IMPRESSION / ASSESSMENT AND PLAN / ED COURSE  Pertinent labs & imaging results that were available during my care of the patient were reviewed by me and considered in my medical decision making (see chart for details).  She presents to the emergency department for evaluation of difficulty breathing, cough, and chest pain.  Chest pain mainly with coughing but some at rest.  No fevers recorded at home the patient has had body aches.  Suspect infectious etiology.  Patient does have significant hypoxemia on arrival.  She reports a history of COPD but does not use home oxygen.  Oxygen saturation improved with nasal cannula.   Labs and imaging reviewed.  Patient  has a pneumonia on chest x-ray along with leukocytosis.  Plan to start antibiotics for community-acquired pneumonia.  The infiltrate is in the upper/middle lobes but the patient does not have risk factors for TB.  Plan to discuss admission with hospitalist given oxygen requirement.   Discussed patient's case with Hospitalist, Dr. Ardyth Harps to request admission. Patient and family (if present) updated with plan. Care transferred to Hospitalist service.  I reviewed all nursing notes, vitals, pertinent old records, EKGs, labs, imaging (as available).  ____________________________________________  FINAL CLINICAL IMPRESSION(S) / ED DIAGNOSES  Final  diagnoses:  Community acquired pneumonia, unspecified laterality  Hypoxemia     MEDICATIONS GIVEN DURING THIS VISIT:  Medications  azithromycin (ZITHROMAX) 500 mg in dextrose 5 % 250 mL IVPB (not administered)  cefTRIAXone (ROCEPHIN) 1 g in dextrose 5 % 50 mL IVPB (not administered)  ipratropium-albuterol (DUONEB) 0.5-2.5 (3) MG/3ML nebulizer solution 3 mL (3 mLs Nebulization Given 06/23/17 1058)  cefTRIAXone (ROCEPHIN) 1 g in dextrose 5 % 50 mL IVPB (0 g Intravenous Stopped 06/23/17 1351)  azithromycin (ZITHROMAX) 500 mg in dextrose 5 % 250 mL IVPB (0 mg Intravenous Stopped 06/23/17 1536)    Note:  This document was prepared using Dragon voice recognition software and may include unintentional dictation errors.  Alona Bene, MD Emergency Medicine    Jaislyn Blinn, Arlyss Repress, MD 06/23/17 [phone]

## 2017-06-23 NOTE — Progress Notes (Signed)
Pharmacy Antibiotic Note  Rita Jackson is a 62 y.o. female admitted on 06/23/2017 with CAP.  Pharmacy has been consulted for Rocephin and Azithromycin dosing.  Plan: Azithromycin 500 mg IV Q24 hours Ceftriaxone 1000 mg IV Q24 hours  Height: 5\' 6"  (167.6 cm) Weight: 164 lb (74.4 kg) IBW/kg (Calculated) : 59.3  Temp (24hrs), Avg:98.9 F (37.2 C), Min:98.9 F (37.2 C), Max:98.9 F (37.2 C)  Recent Labs  Lab 06/23/17 1023  WBC 11.4*  CREATININE 1.10*    Estimated Creatinine Clearance: 56.8 mL/min (A) (by C-G formula based on SCr of 1.1 mg/dL (H)).    No Known Allergies  Antimicrobials this admission: 1/28 Azithromyin >>  1/28 Ceftriaxone >>   Dose adjustments this admission: N/A  Microbiology results: 1/28 BCx: pending  Thank you for allowing pharmacy to be a part of this patient's care.  Tad MooreSteven C Marlow Hendrie 06/23/2017 12:57 PM

## 2017-06-23 NOTE — ED Notes (Signed)
RN tried to wean Pt off oxygen. Saturation dropped to 70s. MD aware. Oxygen reapplied for pt comfort.

## 2017-06-23 NOTE — ED Notes (Signed)
Pt taken to xray 

## 2017-06-23 NOTE — H&P (Signed)
History and Physical    Rita AuJuanita D Chimento XBJ:[id number] DOB: 1955/09/23 DOA: 06/23/2017  Referring MD/NP/PA: Alona BeneJoshua Long, EDP PCP: Mirna MiresHill, Gerald, MD  Patient coming from: Home  Chief Complaint: Shortness of breath, right-sided chest pain  HPI: Rita Jackson is a 62 y.o. female with history only significant for tobacco abuse who presents to the hospital today after 3-4-day history of right-sided chest pain and general malaise.  She has not had fever, chills, but has only had a very mild nonproductive cough.  On arrival she was found to be hypoxic with sats in the 80s on room air, labs are essentially unremarkable other than a white count of 11.4, chest x-ray shows signs of a right upper and right middle lobe pneumonia and admission is requested.  Past Medical/Surgical History: History reviewed. No pertinent past medical history.  History reviewed. No pertinent surgical history.  Social History:  reports that she has been smoking cigarettes.  She has been smoking about 1.00 pack per day. she has never used smokeless tobacco. She reports that she does not drink alcohol or use drugs.  Allergies: No Known Allergies  Family History:  No history of hypertension, diabetes, heart attack or stroke in any immediate family members  Prior to Admission medications   Not on File    Review of Systems: Constitutional: Denies fever, chills, diaphoresis, appetite change and fatigue.  HEENT: Denies photophobia, eye pain, redness, hearing loss, ear pain, congestion, sore throat, rhinorrhea, sneezing, mouth sores, trouble swallowing, neck pain, neck stiffness and tinnitus.   Respiratory positive for shortness of breath, dyspnea on exertion, cough, denies chest pain cardiovascular: Denies chest pain, palpitations and leg swelling.  Gastrointestinal: Denies nausea, vomiting, abdominal pain, diarrhea, constipation, blood in stool and abdominal distention.  Genitourinary: Denies dysuria, urgency,  frequency, hematuria, flank pain and difficulty urinating.  Endocrine: Denies: hot or cold intolerance, sweats, changes in hair or nails, polyuria, polydipsia. Musculoskeletal: Denies myalgias, back pain, joint swelling, arthralgias and gait problem.  Skin: Denies pallor, rash and wound.  Neurological: Denies dizziness, seizures, syncope, weakness, light-headedness, numbness and headaches.  Hematological: Denies adenopathy. Easy bruising, personal or family bleeding history  Psychiatric/Behavioral: Denies suicidal ideation, mood changes, confusion, nervousness, sleep disturbance and agitation    Physical Exam: Vitals:   06/23/17 1500 06/23/17 1530 06/23/17 1600 06/23/17 1700  BP: 123/66 (!) 108/44 119/73 131/63  Pulse: 65 70 77 76  Resp:    16  Temp:      TempSrc:      SpO2: 90% 92% 92% (!) 88%  Weight:    73.9 kg (162 lb 14.7 oz)  Height:    5\' 6"  (1.676 m)     Constitutional: NAD, calm, comfortable Eyes: PERRL, lids and conjunctivae normal ENMT: Mucous membranes are dry. Posterior pharynx clear of any exudate or lesions.Normal dentition.  Neck: normal, supple, no masses, no thyromegaly Respiratory: Decreased breath sounds to right upper and mid lung fields, no wheezing or crackles cardiovascular: Regular rate and rhythm, no murmurs / rubs / gallops. No extremity edema. 2+ pedal pulses. No carotid bruits.  Abdomen: no tenderness, no masses palpated. No hepatosplenomegaly. Bowel sounds positive.  Musculoskeletal: no clubbing / cyanosis. No joint deformity upper and lower extremities. Good ROM, no contractures. Normal muscle tone.  Skin: no rashes, lesions, ulcers. No induration Neurologic: CN 2-12 grossly intact. Sensation intact, DTR normal. Strength 5/5 in all 4.  Psychiatric: Normal judgment and insight. Alert and oriented x 3. Normal mood.    Labs on Admission: I  have personally reviewed the following labs and imaging studies  CBC: Recent Labs  Lab 06/23/17 1023  WBC  11.4*  NEUTROABS 8.8*  HGB 13.8  HCT 43.5  MCV 89.0  PLT 194   Basic Metabolic Panel: Recent Labs  Lab 06/23/17 1023  NA 136  K 3.7  CL 99*  CO2 23  GLUCOSE 160*  BUN 24*  CREATININE 1.10*  CALCIUM 8.3*   GFR: Estimated Creatinine Clearance: 56.6 mL/min (A) (by C-G formula based on SCr of 1.1 mg/dL (H)). Liver Function Tests: No results for input(s): AST, ALT, ALKPHOS, BILITOT, PROT, ALBUMIN in the last 168 hours. No results for input(s): LIPASE, AMYLASE in the last 168 hours. No results for input(s): AMMONIA in the last 168 hours. Coagulation Profile: No results for input(s): INR, PROTIME in the last 168 hours. Cardiac Enzymes: Recent Labs  Lab 06/23/17 1023  TROPONINI <0.03   BNP (last 3 results) No results for input(s): PROBNP in the last 8760 hours. HbA1C: No results for input(s): HGBA1C in the last 72 hours. CBG: No results for input(s): GLUCAP in the last 168 hours. Lipid Profile: No results for input(s): CHOL, HDL, LDLCALC, TRIG, CHOLHDL, LDLDIRECT in the last 72 hours. Thyroid Function Tests: No results for input(s): TSH, T4TOTAL, FREET4, T3FREE, THYROIDAB in the last 72 hours. Anemia Panel: No results for input(s): VITAMINB12, FOLATE, FERRITIN, TIBC, IRON, RETICCTPCT in the last 72 hours. Urine analysis: No results found for: COLORURINE, APPEARANCEUR, LABSPEC, PHURINE, GLUCOSEU, HGBUR, BILIRUBINUR, KETONESUR, PROTEINUR, UROBILINOGEN, NITRITE, LEUKOCYTESUR Sepsis Labs: @LABRCNTIP (procalcitonin:4,lacticidven:4) ) Recent Results (from the past 240 hour(s))  Blood Culture (routine x 2)     Status: None (Preliminary result)   Collection Time: 06/23/17 12:10 PM  Result Value Ref Range Status   Specimen Description BLOOD LEFT ARM  Final   Special Requests   Final    BOTTLES DRAWN AEROBIC AND ANAEROBIC Blood Culture adequate volume   Culture PENDING  Incomplete   Report Status PENDING  Incomplete  Blood Culture (routine x 2)     Status: None (Preliminary  result)   Collection Time: 06/23/17 12:16 PM  Result Value Ref Range Status   Specimen Description BLOOD RIGHT ARM  Final   Special Requests   Final    BOTTLES DRAWN AEROBIC AND ANAEROBIC Blood Culture adequate volume   Culture PENDING  Incomplete   Report Status PENDING  Incomplete     Radiological Exams on Admission: Dg Chest 2 View  Result Date: 06/23/2017 CLINICAL DATA:  Cough and congestion x3 days with body aches. Patient states right sided chest pain with coughing and breathing. Current smoker. EXAM: CHEST  2 VIEW COMPARISON:  Normal cardiac silhouette. There is segmental airspace disease in the RIGHT upper lobe. More diffuse airspace disease in the RIGHT lower lobe. Obscuration of the heart border on lateral projection. No acute osseous abnormality. FINDINGS: The heart size and mediastinal contours are within normal limits. Both lungs are clear. The visualized skeletal structures are unremarkable. IMPRESSION: 1. RIGHT upper lobe pneumonia. 2. Potential RIGHT middle lobe pneumonia. 3. Followup PA and lateral chest X-ray is recommended in 3-4 weeks following trial of antibiotic therapy to ensure resolution and exclude underlying malignancy. Electronically Signed   By: Genevive Bi M.D.   On: 06/23/2017 11:02    EKG: Independently reviewed.  Sinus tachycardia at a rate of 103, left atrial enlargement, no acute ischemic changes  Assessment/Plan Principal Problem:   CAP (community acquired pneumonia) Active Problems:   Acute on chronic respiratory failure with  hypoxia (HCC)   Tobacco abuse    Acute hypoxemic respiratory failure -Presumably due to lobar pneumonia based on chest x-ray results. -We will start on Rocephin/azathioprine community-acquired coverage. -Influenza PCR is negative. -Blood/sputum cultures. -Strep pneumo/Legionella urine antigen have been ordered. -She has at least a moderate risk for PE and given her overall lack of symptoms for typical pneumonia given  severity of infiltrate on chest x-ray, I wonder if this may be more of a lung infarct.  Because of this we will order CT angio for further clarification.  Tobacco abuse -Counseled on cessation.   DVT prophylaxis: Lovenox Code Status: Full code Family Communication: Granddaughter at bedside updated on plan of care and questions answered Disposition Plan: Pending medical improvement Consults called: None Admission status: Inpatient   Time Spent: 85 minutes  Lorrayne Ismael Philip Aspen MD [facility] Pager [phone]  If 7PM-7AM, please contact night-coverage [email or web address] Password [facility]  06/23/2017, 6:53 PM

## 2017-06-23 NOTE — ED Notes (Signed)
Report given to 300 floor RN Carollee HerterShannon

## 2017-06-24 LAB — BASIC METABOLIC PANEL
Anion gap: 10 (ref 5–15)
BUN: 13 mg/dL (ref 6–20)
CHLORIDE: 104 mmol/L (ref 101–111)
CO2: 23 mmol/L (ref 22–32)
Calcium: 8 mg/dL — ABNORMAL LOW (ref 8.9–10.3)
Creatinine, Ser: 0.81 mg/dL (ref 0.44–1.00)
GFR calc Af Amer: >60 mL/min (ref >60)
GLUCOSE: 126 mg/dL — AB (ref 65–99)
POTASSIUM: 3.9 mmol/L (ref 3.5–5.1)
SODIUM: 137 mmol/L (ref 135–145)

## 2017-06-24 LAB — CBC
HEMATOCRIT: 39.6 % (ref 36.0–46.0)
Hemoglobin: 12.5 g/dL (ref 12.0–15.0)
MCH: 28.1 pg (ref 26.0–34.0)
MCHC: 31.6 g/dL (ref 30.0–36.0)
MCV: 89 fL (ref 78.0–100.0)
Platelets: 204 10*3/uL (ref 150–400)
RBC: 4.45 MIL/uL (ref 3.87–5.11)
RDW: 14.2 % (ref 11.5–15.5)
WBC: 10.9 10*3/uL — AB (ref 4.0–10.5)

## 2017-06-24 MED ORDER — GUAIFENESIN-DM 100-10 MG/5ML PO SYRP
5.0000 mL | ORAL_SOLUTION | ORAL | Status: DC | PRN
  Administered 2017-06-24 – 2017-06-25 (×2): 5 mL via ORAL
  Filled 2017-06-24 (×2): qty 5

## 2017-06-24 NOTE — Progress Notes (Addendum)
PROGRESS NOTE    Rita Jackson  WUJ:[id number] DOB: [birth year] DOA: 06/23/2017 PCP: Mirna Mires, MD     Brief Narrative:  62 year old woman admitted from home on 1/28 due to general malaise and cough.  Found to have right upper lobe and right middle lobe pneumonia and admission requested.    Assessment & Plan:   Principal Problem:   CAP (community acquired pneumonia) Active Problems:   Acute on chronic respiratory failure with hypoxia (HCC)   Tobacco abuse   Acute hypoxemic respiratory failure -Due to community-acquired pneumonia/lobar pneumonia. -Continue Rocephin and azithromycin for now. -Culture data remains negative. -Continue oxygen as tolerated and wean to keep sats above 90%. -CT angiogram of the chest is negative for PE. -Influenza PCR negative.  Tobacco abuse -Counseled on cessation    DVT prophylaxis: Lovenox Code Status: Full code Family Communication: Patient only Disposition Plan: Pending improvement  Consultants:   None  Procedures:   None  Antimicrobials:  Anti-infectives (From admission, onward)   Start     Dose/Rate Route Frequency Ordered Stop   06/24/17 1400  azithromycin (ZITHROMAX) 500 mg in dextrose 5 % 250 mL IVPB     500 mg 250 mL/hr over 60 Minutes Intravenous Every 24 hours 06/23/17 1852 06/30/17 1359   06/24/17 1300  azithromycin (ZITHROMAX) 500 mg in dextrose 5 % 250 mL IVPB  Status:  Discontinued     500 mg 250 mL/hr over 60 Minutes Intravenous Every 24 hours 06/23/17 1259 06/23/17 1852   06/24/17 1300  cefTRIAXone (ROCEPHIN) 1 g in dextrose 5 % 50 mL IVPB     1 g 100 mL/hr over 30 Minutes Intravenous Every 24 hours 06/23/17 1852 07/03/17 1259   06/24/17 1200  cefTRIAXone (ROCEPHIN) 1 g in dextrose 5 % 50 mL IVPB  Status:  Discontinued     1 g 100 mL/hr over 30 Minutes Intravenous Every 24 hours 06/23/17 1259 06/23/17 1852   06/23/17 1200  cefTRIAXone (ROCEPHIN) 1 g in dextrose 5 % 50 mL IVPB     1 g 100 mL/hr over 30  Minutes Intravenous  Once 06/23/17 1155 06/23/17 1351   06/23/17 1200  azithromycin (ZITHROMAX) 500 mg in dextrose 5 % 250 mL IVPB     500 mg 250 mL/hr over 60 Minutes Intravenous  Once 06/23/17 1155 06/23/17 1536       Subjective: Still feels acutely sick with cough, malaise, myalgias.  Objective: Vitals:   06/23/17 2227 06/24/17 0520 06/24/17 0524 06/24/17 1453  BP: (!) 112/50 (!) 126/57  (!) 110/52  Pulse: 87 91  88  Resp: 16 16  18   Temp: 99.1 F (37.3 C) 99 F (37.2 C)  98.7 F (37.1 C)  TempSrc: Oral Oral  Oral  SpO2: 98% 91% 93% 91%  Weight:      Height:        Intake/Output Summary (Last 24 hours) at 06/24/2017 1532 Last data filed at 06/24/2017 1500 Gross per 24 hour  Intake 1190 ml  Output -  Net 1190 ml   Filed Weights   06/23/17 0947 06/23/17 1700  Weight: 74.4 kg (164 lb) 73.9 kg (162 lb 14.7 oz)    Examination:  General exam: Alert, awake, oriented x 3 Respiratory system: Rhonchi to the right upper chest, no crackles or wheezing Cardiovascular system:RRR. No murmurs, rubs, gallops. Gastrointestinal system: Abdomen is nondistended, soft and nontender. No organomegaly or masses felt. Normal bowel sounds heard. Central nervous system: Alert and oriented. No focal neurological deficits. Extremities: No  C/C/E, +pedal pulses Skin: No rashes, lesions or ulcers Psychiatry: Judgement and insight appear normal. Mood & affect appropriate.     Data Reviewed: I have personally reviewed following labs and imaging studies  CBC: Recent Labs  Lab 06/23/17 1023 06/24/17 0503  WBC 11.4* 10.9*  NEUTROABS 8.8*  --   HGB 13.8 12.5  HCT 43.5 39.6  MCV 89.0 89.0  PLT 194 204   Basic Metabolic Panel: Recent Labs  Lab 06/23/17 1023 06/24/17 0503  NA 136 137  K 3.7 3.9  CL 99* 104  CO2 23 23  GLUCOSE 160* 126*  BUN 24* 13  CREATININE 1.10* 0.81  CALCIUM 8.3* 8.0*   GFR: Estimated Creatinine Clearance: 76.9 mL/min (by C-G formula based on SCr of 0.81  mg/dL). Liver Function Tests: No results for input(s): AST, ALT, ALKPHOS, BILITOT, PROT, ALBUMIN in the last 168 hours. No results for input(s): LIPASE, AMYLASE in the last 168 hours. No results for input(s): AMMONIA in the last 168 hours. Coagulation Profile: No results for input(s): INR, PROTIME in the last 168 hours. Cardiac Enzymes: Recent Labs  Lab 06/23/17 1023  TROPONINI <0.03   BNP (last 3 results) No results for input(s): PROBNP in the last 8760 hours. HbA1C: No results for input(s): HGBA1C in the last 72 hours. CBG: No results for input(s): GLUCAP in the last 168 hours. Lipid Profile: No results for input(s): CHOL, HDL, LDLCALC, TRIG, CHOLHDL, LDLDIRECT in the last 72 hours. Thyroid Function Tests: No results for input(s): TSH, T4TOTAL, FREET4, T3FREE, THYROIDAB in the last 72 hours. Anemia Panel: No results for input(s): VITAMINB12, FOLATE, FERRITIN, TIBC, IRON, RETICCTPCT in the last 72 hours. Urine analysis: No results found for: COLORURINE, APPEARANCEUR, LABSPEC, PHURINE, GLUCOSEU, HGBUR, BILIRUBINUR, KETONESUR, PROTEINUR, UROBILINOGEN, NITRITE, LEUKOCYTESUR Sepsis Labs: @LABRCNTIP (procalcitonin:4,lacticidven:4)  ) Recent Results (from the past 240 hour(s))  Blood Culture (routine x 2)     Status: None (Preliminary result)   Collection Time: 06/23/17 12:10 PM  Result Value Ref Range Status   Specimen Description BLOOD LEFT ARM  Final   Special Requests   Final    BOTTLES DRAWN AEROBIC AND ANAEROBIC Blood Culture adequate volume   Culture NO GROWTH < 24 HOURS  Final   Report Status PENDING  Incomplete  Blood Culture (routine x 2)     Status: None (Preliminary result)   Collection Time: 06/23/17 12:16 PM  Result Value Ref Range Status   Specimen Description BLOOD RIGHT ARM  Final   Special Requests   Final    BOTTLES DRAWN AEROBIC AND ANAEROBIC Blood Culture adequate volume   Culture NO GROWTH < 24 HOURS  Final   Report Status PENDING  Incomplete          Radiology Studies: Dg Chest 2 View  Result Date: 06/23/2017 CLINICAL DATA:  Cough and congestion x3 days with body aches. Patient states right sided chest pain with coughing and breathing. Current smoker. EXAM: CHEST  2 VIEW COMPARISON:  Normal cardiac silhouette. There is segmental airspace disease in the RIGHT upper lobe. More diffuse airspace disease in the RIGHT lower lobe. Obscuration of the heart border on lateral projection. No acute osseous abnormality. FINDINGS: The heart size and mediastinal contours are within normal limits. Both lungs are clear. The visualized skeletal structures are unremarkable. IMPRESSION: 1. RIGHT upper lobe pneumonia. 2. Potential RIGHT middle lobe pneumonia. 3. Followup PA and lateral chest X-ray is recommended in 3-4 weeks following trial of antibiotic therapy to ensure resolution and exclude underlying malignancy. Electronically  Signed   By: Genevive Bi M.D.   On: 06/23/2017 11:02   Ct Angio Chest Pe W Or Wo Contrast  Result Date: 06/23/2017 CLINICAL DATA:  Right-sided chest pain and generalized malaise for 3-4 days. EXAM: CT ANGIOGRAPHY CHEST WITH CONTRAST TECHNIQUE: Multidetector CT imaging of the chest was performed using the standard protocol during bolus administration of intravenous contrast. Multiplanar CT image reconstructions and MIPs were obtained to evaluate the vascular anatomy. CONTRAST:  ISOVUE-370 IOPAMIDOL (ISOVUE-370) INJECTION 76% COMPARISON:  Chest radiograph 06/23/2017 FINDINGS: Cardiovascular: Motion artifact limits the examination. There is good opacification of the central and segmental pulmonary arteries. No focal filling defects. No evidence of significant pulmonary embolus. Mild cardiac enlargement. Small pericardial effusion. Normal caliber thoracic aorta. Great vessel origins are patent. Mediastinum/Nodes: Prominent right hilar lymph node measuring about 10 mm diameter, likely reactive. Esophagus is decompressed. Thyroid  gland is unremarkable. Lungs/Pleura: Small bilateral pleural effusions with basilar atelectasis, greater on the right. Airspace disease with focal consolidations in the right upper lung and right middle lung likely representing pneumonia. No pneumothorax. Airways are patent. Upper Abdomen: 1.5 cm diameter cystic structure demonstrated in the splenic hilum, likely arising from the tail of the pancreas. No acute process demonstrated in the visualized upper abdomen. Musculoskeletal: No chest wall abnormality. No acute or significant osseous findings. Review of the MIP images confirms the above findings. IMPRESSION: 1. No evidence of significant pulmonary embolus. 2. Airspace disease with focal areas of consolidation in the right upper lung and right middle lung likely representing pneumonia. Followup PA and lateral chest X-ray is recommended in 3-4 weeks following appropriate clinical therapy to ensure resolution and exclude underlying malignancy. 3. Small bilateral pleural effusions with basilar atelectasis, greater on the right. 4. Cardiac enlargement with small pericardial effusion. 5. 1.5 cm diameter cystic structure demonstrated in the splenic hilum, likely arising from the tail of the pancreas. Suggest follow-up imaging yearly for 5 years. Electronically Signed   By: Burman Nieves M.D.   On: 06/23/2017 21:27        Scheduled Meds: . enoxaparin (LOVENOX) injection  40 mg Subcutaneous Q24H   Continuous Infusions: . sodium chloride 100 mL/hr at 06/24/17 1206  . azithromycin 500 mg (06/24/17 1258)  . cefTRIAXone (ROCEPHIN)  IV Stopped (06/24/17 1245)     LOS: 1 day    Time spent: 25 minutes. Greater than 50% of this time was spent in direct contact with the patient coordinating care.     Chaya Jan, MD [facility] Pager [phone]  If 7PM-7AM, please contact night-coverage [email or web address] Password [facility] 06/24/2017, 3:32 PM

## 2017-06-24 NOTE — Progress Notes (Signed)
Talked with Dr. Ardyth HarpsHernandez about the pt already having a flu test in the ED that came back negative so she ordered that the droplet precautions and repeat of the flu test be cancelled/discontinued.

## 2017-06-24 NOTE — Plan of Care (Signed)
Pt progressing

## 2017-06-25 ENCOUNTER — Encounter (HOSPITAL_COMMUNITY): Payer: Self-pay | Admitting: Family Medicine

## 2017-06-25 DIAGNOSIS — J189 Pneumonia, unspecified organism: Secondary | ICD-10-CM

## 2017-06-25 DIAGNOSIS — J9621 Acute and chronic respiratory failure with hypoxia: Secondary | ICD-10-CM

## 2017-06-25 DIAGNOSIS — Z720 Tobacco use: Secondary | ICD-10-CM

## 2017-06-25 LAB — CBC
HCT: 39 % (ref 36.0–46.0)
HEMOGLOBIN: 12.1 g/dL (ref 12.0–15.0)
MCH: 27.6 pg (ref 26.0–34.0)
MCHC: 31 g/dL (ref 30.0–36.0)
MCV: 88.8 fL (ref 78.0–100.0)
Platelets: 213 10*3/uL (ref 150–400)
RBC: 4.39 MIL/uL (ref 3.87–5.11)
RDW: 14 % (ref 11.5–15.5)
WBC: 9.2 10*3/uL (ref 4.0–10.5)

## 2017-06-25 LAB — BASIC METABOLIC PANEL
ANION GAP: 11 (ref 5–15)
BUN: 7 mg/dL (ref 6–20)
CALCIUM: 7.8 mg/dL — AB (ref 8.9–10.3)
CO2: 23 mmol/L (ref 22–32)
Chloride: 104 mmol/L (ref 101–111)
Creatinine, Ser: 0.75 mg/dL (ref 0.44–1.00)
GFR calc Af Amer: >60 mL/min (ref >60)
GFR calc non Af Amer: >60 mL/min (ref >60)
GLUCOSE: 106 mg/dL — AB (ref 65–99)
Potassium: 3.8 mmol/L (ref 3.5–5.1)
Sodium: 138 mmol/L (ref 135–145)

## 2017-06-25 LAB — EXPECTORATED SPUTUM ASSESSMENT W REFEX TO RESP CULTURE

## 2017-06-25 LAB — STREP PNEUMONIAE URINARY ANTIGEN: STREP PNEUMO URINARY ANTIGEN: NEGATIVE

## 2017-06-25 LAB — HIV ANTIBODY (ROUTINE TESTING W REFLEX): HIV SCREEN 4TH GENERATION: NONREACTIVE

## 2017-06-25 MED ORDER — AZITHROMYCIN 250 MG PO TABS
500.0000 mg | ORAL_TABLET | Freq: Every day | ORAL | Status: DC
  Administered 2017-06-25: 500 mg via ORAL
  Filled 2017-06-25 (×2): qty 2

## 2017-06-25 MED ORDER — NICOTINE 14 MG/24HR TD PT24
14.0000 mg | MEDICATED_PATCH | Freq: Every day | TRANSDERMAL | Status: DC
  Administered 2017-06-25: 14 mg via TRANSDERMAL
  Filled 2017-06-25: qty 1

## 2017-06-25 MED ORDER — IPRATROPIUM-ALBUTEROL 0.5-2.5 (3) MG/3ML IN SOLN
3.0000 mL | RESPIRATORY_TRACT | Status: DC
  Administered 2017-06-25: 3 mL via RESPIRATORY_TRACT
  Filled 2017-06-25: qty 3

## 2017-06-25 MED ORDER — IPRATROPIUM-ALBUTEROL 0.5-2.5 (3) MG/3ML IN SOLN
3.0000 mL | Freq: Three times a day (TID) | RESPIRATORY_TRACT | Status: DC
  Administered 2017-06-25 – 2017-06-26 (×2): 3 mL via RESPIRATORY_TRACT
  Filled 2017-06-25 (×2): qty 3

## 2017-06-25 NOTE — Care Management Note (Signed)
Case Management Note  Patient Details  Name: Rita Jackson MRN: [id number] Date of Birth: 05/10/1956  Subjective/Objective:     Admitted with CAP. Has history of COPD. CM referred for neb machine and home oxygen. Pt has PCP, insurance and O2 assessment has been ordered. Pt has no preference of DME provider. [facility]                Action/Plan: Therisa DoyneKathy Cheek, [facility], given referral and will pull pt info from chart. Pt will have neb machine and port tank delivered to room prior to DC. DC anticipated tomorrow.   Expected Discharge Date:    06/26/2017              Expected Discharge Plan:  Home/Self Care  In-House Referral:  NA  Discharge planning Services  CM Consult  Post Acute Care Choice:  Durable Medical Equipment Choice offered to:  Patient  DME Arranged:  Nebulizer machine DME Agency:  [facility]  Status of Service:  In process, will continue to follow  If discussed at Long Length of Stay Meetings, dates discussed:    Additional Comments:  Malcolm MetroChildress, Mardy Hoppe Demske, RN 06/25/2017, 2:41 PM

## 2017-06-25 NOTE — Progress Notes (Signed)
PROGRESS NOTE    Rita Jackson  ZOX:[id number] DOB: 1956/01/08 DOA: 06/23/2017 PCP: Mirna MiresHill, Gerald, MD     Brief Narrative:  62 year old woman admitted from home on 1/28 due to general malaise and cough.  Found to have right upper lobe and right middle lobe pneumonia and admission requested.  Assessment & Plan:   Principal Problem:   CAP (community acquired pneumonia) Active Problems:   Acute on chronic respiratory failure with hypoxia (HCC)   Tobacco abuse   Acute hypoxemic respiratory failure -Due to community-acquired pneumonia/lobar pneumonia. -Continue Rocephin and azithromycin for now. -Culture data remains negative. -Continue oxygen as tolerated and wean to keep sats above 90%. -CT angiogram of the chest is negative for PE. -Influenza PCR negative. -Plan to ambulate more today and wean off oxygen if possible, if not, will likely send home on oxygen -Duonebs ordered x 4 doses.    Tobacco abuse -Counseled on cessation Ready to quit: No Counseling given: Yes  DVT prophylaxis: Lovenox Code Status: Full code Family Communication: Patient only Disposition Plan: Home tomorrow if stable  Consultants:   None  Procedures:   None  Antimicrobials:  Anti-infectives (From admission, onward)   Start     Dose/Rate Route Frequency Ordered Stop   06/24/17 1400  azithromycin (ZITHROMAX) 500 mg in dextrose 5 % 250 mL IVPB     500 mg 250 mL/hr over 60 Minutes Intravenous Every 24 hours 06/23/17 1852 06/30/17 1359   06/24/17 1300  azithromycin (ZITHROMAX) 500 mg in dextrose 5 % 250 mL IVPB  Status:  Discontinued     500 mg 250 mL/hr over 60 Minutes Intravenous Every 24 hours 06/23/17 1259 06/23/17 1852   06/24/17 1300  cefTRIAXone (ROCEPHIN) 1 g in dextrose 5 % 50 mL IVPB     1 g 100 mL/hr over 30 Minutes Intravenous Every 24 hours 06/23/17 1852 07/03/17 1259   06/24/17 1200  cefTRIAXone (ROCEPHIN) 1 g in dextrose 5 % 50 mL IVPB  Status:  Discontinued     1 g 100  mL/hr over 30 Minutes Intravenous Every 24 hours 06/23/17 1259 06/23/17 1852   06/23/17 1200  cefTRIAXone (ROCEPHIN) 1 g in dextrose 5 % 50 mL IVPB     1 g 100 mL/hr over 30 Minutes Intravenous  Once 06/23/17 1155 06/23/17 1351   06/23/17 1200  azithromycin (ZITHROMAX) 500 mg in dextrose 5 % 250 mL IVPB     500 mg 250 mL/hr over 60 Minutes Intravenous  Once 06/23/17 1155 06/23/17 1536     Subjective: Pt still on oxygen, SOB, cough, not ambulating much.   Objective: Vitals:   06/24/17 1453 06/24/17 2042 06/24/17 2115 06/25/17 0541  BP: (!) 110/52  (!) 121/55 (!) 135/58  Pulse: 88  88 91  Resp: 18  18 18   Temp: 98.7 F (37.1 C)  99.3 F (37.4 C) 99 F (37.2 C)  TempSrc: Oral  Oral Oral  SpO2: 91% 91% 90% 93%  Weight:      Height:        Intake/Output Summary (Last 24 hours) at 06/25/2017 0855 Last data filed at 06/24/2017 1900 Gross per 24 hour  Intake 2080 ml  Output -  Net 2080 ml   Filed Weights   06/23/17 0947 06/23/17 1700  Weight: 74.4 kg (164 lb) 73.9 kg (162 lb 14.7 oz)    Examination:  General exam: Alert, awake, oriented x 3 Respiratory system: Rhonchi to the right upper chest, no crackles or wheezing Cardiovascular system: normal s1,  s2 sounds.  No murmurs, rubs, gallops. Gastrointestinal system: Abdomen is nondistended, soft and nontender. No organomegaly or masses felt. Normal bowel sounds heard. Central nervous system: Alert and oriented. No focal neurological deficits. Extremities: No C/C/E, +pedal pulses Skin: No rashes, lesions or ulcers Psychiatry: Judgement and insight appear normal. Mood & affect appropriate.   Data Reviewed: I have personally reviewed following labs and imaging studies  CBC: Recent Labs  Lab 06/23/17 1023 06/24/17 0503 06/25/17 0443  WBC 11.4* 10.9* 9.2  NEUTROABS 8.8*  --   --   HGB 13.8 12.5 12.1  HCT 43.5 39.6 39.0  MCV 89.0 89.0 88.8  PLT 194 204 213   Basic Metabolic Panel: Recent Labs  Lab 06/23/17 1023  06/24/17 0503 06/25/17 0443  NA 136 137 138  K 3.7 3.9 3.8  CL 99* 104 104  CO2 23 23 23   GLUCOSE 160* 126* 106*  BUN 24* 13 7  CREATININE 1.10* 0.81 0.75  CALCIUM 8.3* 8.0* 7.8*   GFR: Estimated Creatinine Clearance: 77.8 mL/min (by C-G formula based on SCr of 0.75 mg/dL). Liver Function Tests: No results for input(s): AST, ALT, ALKPHOS, BILITOT, PROT, ALBUMIN in the last 168 hours. No results for input(s): LIPASE, AMYLASE in the last 168 hours. No results for input(s): AMMONIA in the last 168 hours. Coagulation Profile: No results for input(s): INR, PROTIME in the last 168 hours. Cardiac Enzymes: Recent Labs  Lab 06/23/17 1023  TROPONINI <0.03   BNP (last 3 results) No results for input(s): PROBNP in the last 8760 hours. HbA1C: No results for input(s): HGBA1C in the last 72 hours. CBG: No results for input(s): GLUCAP in the last 168 hours. Lipid Profile: No results for input(s): CHOL, HDL, LDLCALC, TRIG, CHOLHDL, LDLDIRECT in the last 72 hours. Thyroid Function Tests: No results for input(s): TSH, T4TOTAL, FREET4, T3FREE, THYROIDAB in the last 72 hours. Anemia Panel: No results for input(s): VITAMINB12, FOLATE, FERRITIN, TIBC, IRON, RETICCTPCT in the last 72 hours. Urine analysis: No results found for: COLORURINE, APPEARANCEUR, LABSPEC, PHURINE, GLUCOSEU, HGBUR, BILIRUBINUR, KETONESUR, PROTEINUR, UROBILINOGEN, NITRITE, LEUKOCYTESUR  Recent Results (from the past 240 hour(s))  Blood Culture (routine x 2)     Status: None (Preliminary result)   Collection Time: 06/23/17 12:10 PM  Result Value Ref Range Status   Specimen Description BLOOD LEFT ARM  Final   Special Requests   Final    BOTTLES DRAWN AEROBIC AND ANAEROBIC Blood Culture adequate volume   Culture NO GROWTH 2 DAYS  Final   Report Status PENDING  Incomplete  Blood Culture (routine x 2)     Status: None (Preliminary result)   Collection Time: 06/23/17 12:16 PM  Result Value Ref Range Status   Specimen  Description BLOOD RIGHT ARM  Final   Special Requests   Final    BOTTLES DRAWN AEROBIC AND ANAEROBIC Blood Culture adequate volume   Culture NO GROWTH 2 DAYS  Final   Report Status PENDING  Incomplete    Radiology Studies: Dg Chest 2 View  Result Date: 06/23/2017 CLINICAL DATA:  Cough and congestion x3 days with body aches. Patient states right sided chest pain with coughing and breathing. Current smoker. EXAM: CHEST  2 VIEW COMPARISON:  Normal cardiac silhouette. There is segmental airspace disease in the RIGHT upper lobe. More diffuse airspace disease in the RIGHT lower lobe. Obscuration of the heart border on lateral projection. No acute osseous abnormality. FINDINGS: The heart size and mediastinal contours are within normal limits. Both lungs are clear.  The visualized skeletal structures are unremarkable. IMPRESSION: 1. RIGHT upper lobe pneumonia. 2. Potential RIGHT middle lobe pneumonia. 3. Followup PA and lateral chest X-ray is recommended in 3-4 weeks following trial of antibiotic therapy to ensure resolution and exclude underlying malignancy. Electronically Signed   By: Genevive Bi M.D.   On: 06/23/2017 11:02   Ct Angio Chest Pe W Or Wo Contrast  Result Date: 06/23/2017 CLINICAL DATA:  Right-sided chest pain and generalized malaise for 3-4 days. EXAM: CT ANGIOGRAPHY CHEST WITH CONTRAST TECHNIQUE: Multidetector CT imaging of the chest was performed using the standard protocol during bolus administration of intravenous contrast. Multiplanar CT image reconstructions and MIPs were obtained to evaluate the vascular anatomy. CONTRAST:  ISOVUE-370 IOPAMIDOL (ISOVUE-370) INJECTION 76% COMPARISON:  Chest radiograph 06/23/2017 FINDINGS: Cardiovascular: Motion artifact limits the examination. There is good opacification of the central and segmental pulmonary arteries. No focal filling defects. No evidence of significant pulmonary embolus. Mild cardiac enlargement. Small pericardial effusion.  Normal caliber thoracic aorta. Great vessel origins are patent. Mediastinum/Nodes: Prominent right hilar lymph node measuring about 10 mm diameter, likely reactive. Esophagus is decompressed. Thyroid gland is unremarkable. Lungs/Pleura: Small bilateral pleural effusions with basilar atelectasis, greater on the right. Airspace disease with focal consolidations in the right upper lung and right middle lung likely representing pneumonia. No pneumothorax. Airways are patent. Upper Abdomen: 1.5 cm diameter cystic structure demonstrated in the splenic hilum, likely arising from the tail of the pancreas. No acute process demonstrated in the visualized upper abdomen. Musculoskeletal: No chest wall abnormality. No acute or significant osseous findings. Review of the MIP images confirms the above findings. IMPRESSION: 1. No evidence of significant pulmonary embolus. 2. Airspace disease with focal areas of consolidation in the right upper lung and right middle lung likely representing pneumonia. Followup PA and lateral chest X-ray is recommended in 3-4 weeks following appropriate clinical therapy to ensure resolution and exclude underlying malignancy. 3. Small bilateral pleural effusions with basilar atelectasis, greater on the right. 4. Cardiac enlargement with small pericardial effusion. 5. 1.5 cm diameter cystic structure demonstrated in the splenic hilum, likely arising from the tail of the pancreas. Suggest follow-up imaging yearly for 5 years. Electronically Signed   By: Burman Nieves M.D.   On: 06/23/2017 21:27   Scheduled Meds: . enoxaparin (LOVENOX) injection  40 mg Subcutaneous Q24H  . ipratropium-albuterol  3 mL Nebulization Q4H   Continuous Infusions: . sodium chloride 100 mL/hr at 06/25/17 0634  . azithromycin Stopped (06/24/17 1400)  . cefTRIAXone (ROCEPHIN)  IV Stopped (06/24/17 1245)    LOS: 2 days   Time spent: 25 minutes. Greater than 50% of this time was spent in direct contact with the  patient coordinating care.   Standley Dakins, MD [facility] Pager [phone]  If 7PM-7AM, please contact night-coverage [email or web address] Password [email or web address] 06/25/2017, 8:55 AM

## 2017-06-25 NOTE — Progress Notes (Signed)
SATURATION QUALIFICATIONS: (This note is used to comply with regulatory documentation for home oxygen)  Patient Saturations on Room Air at Rest = 60%  Patient Saturations on Room Air while Ambulating = N/A%  Patient Saturations on 3 Liters of oxygen while at rest = 93%  Please briefly explain why patient needs home oxygen:

## 2017-06-26 ENCOUNTER — Encounter (HOSPITAL_COMMUNITY): Payer: Self-pay | Admitting: Family Medicine

## 2017-06-26 MED ORDER — GUAIFENESIN-DM 100-10 MG/5ML PO SYRP: 5.0000 mL | ORAL_SOLUTION | ORAL | 0 refills | Status: DC | PRN

## 2017-06-26 MED ORDER — DOXYCYCLINE HYCLATE 100 MG PO TABS: 100.0000 mg | ORAL_TABLET | Freq: Two times a day (BID) | ORAL | 0 refills | Status: AC

## 2017-06-26 MED ORDER — IPRATROPIUM-ALBUTEROL 0.5-2.5 (3) MG/3ML IN SOLN: 3.0000 mL | RESPIRATORY_TRACT | 0 refills | Status: DC | PRN

## 2017-06-26 MED ORDER — DOXYCYCLINE HYCLATE 100 MG PO TABS
100.0000 mg | ORAL_TABLET | Freq: Two times a day (BID) | ORAL | Status: DC
  Administered 2017-06-26: 100 mg via ORAL
  Filled 2017-06-26: qty 1

## 2017-06-26 NOTE — Discharge Instructions (Signed)
Follow with Primary MD  Hill, Gerald, MD  and other consultant's as instructed your Hospitalist MD ° °Please get a complete blood count and chemistry panel checked by your Primary MD at your next visit, and again as instructed by your Primary MD. ° °Get Medicines reviewed and adjusted: °Please take all your medications with you for your next visit with your Primary MD ° °Laboratory/radiological data: °Please request your Primary MD to go over all hospital tests and procedure/radiological results at the follow up, please ask your Primary MD to get all Hospital records sent to his/her office. ° °In some cases, they will be blood work, cultures and biopsy results pending at the time of your discharge. Please request that your primary care M.D. follows up on these results. ° °Also Note the following: °If you experience worsening of your admission symptoms, develop shortness of breath, life threatening emergency, suicidal or homicidal thoughts you must seek medical attention immediately by calling 911 or calling your MD immediately  if symptoms less severe. ° °You must read complete instructions/literature along with all the possible adverse reactions/side effects for all the Medicines you take and that have been prescribed to you. Take any new Medicines after you have completely understood and accpet all the possible adverse reactions/side effects.  ° °Do not drive when taking Pain medications or sleeping medications (Benzodaizepines) ° °Do not take more than prescribed Pain, Sleep and Anxiety Medications. It is not advisable to combine anxiety,sleep and pain medications without talking with your primary care practitioner ° °Special Instructions: If you have smoked or chewed Tobacco  in the last 2 yrs please stop smoking, stop any regular Alcohol  and or any Recreational drug use. ° °Wear Seat belts while driving. ° °Please note: °You were cared for by a hospitalist during your hospital stay. Once you are discharged,  your primary care physician will handle any further medical issues. Please note that NO REFILLS for any discharge medications will be authorized once you are discharged, as it is imperative that you return to your primary care physician (or establish a relationship with a primary care physician if you do not have one) for your post hospital discharge needs so that they can reassess your need for medications and monitor your lab values. ° ° ° ° °

## 2017-06-26 NOTE — Care Management Note (Signed)
Case Management Note  Patient Details  Name: Rita Jackson MRN: [id number] Date of Birth: [birth year]  Expected Discharge Date:  06/26/17               Expected Discharge Plan:  Home/Self Care  In-House Referral:  NA  Discharge planning Services  CM Consult  Post Acute Care Choice:  Durable Medical Equipment Choice offered to:  Patient  DME Arranged:  Nebulizer machine DME Agency:  [facility]  Status of Service:  Completed, signed off  Additional Comments: Pt discharging home today. DME has been delivered to pt room.   Malcolm Metrohildress, Infantof Villagomez Demske, RN 06/26/2017, 9:14 AM

## 2017-06-26 NOTE — Progress Notes (Signed)
[facility]  Had 02 canister and neb which was delivered yesterday to room and taken out to car. Work note given.  Taken by w[facility] to car.  Son to drive home

## 2017-06-26 NOTE — Discharge Summary (Signed)
Physician Discharge Summary  Rita Jackson Rayburn KGM:[id number] DOB: 1956/04/08 DOA: 06/23/2017  PCP: Mirna MiresHill, Gerald, MD  Admit date: 06/23/2017 Discharge date: 06/26/2017  Admitted From: Home  Disposition: Home  Recommendations for Outpatient Follow-up:  1. Follow up with PCP in 1 weeks 2. Please obtain BMP/CBC in one week 3. Please follow up on the following pending results: final culture data  Discharge Condition: STABLE   CODE STATUS: FULL    Brief Hospitalization Summary: Please see all hospital notes, images, labs for full details of the hospitalization.  HPI: Rita Jackson Vancuren is a 62 y.o. female with history only significant for tobacco abuse who presents to the hospital today after 3-4-day history of right-sided chest pain and general malaise.  She has not had fever, chills, but has only had a very mild nonproductive cough.  On arrival she was found to be hypoxic with sats in the 80s on room air, labs are essentially unremarkable other than a white count of 11.4, chest x-ray shows signs of a right upper and right middle lobe pneumonia and admission is requested.  Brief Narrative:  62 year old woman admitted from home on 1/28 due to general malaise and cough.  Found to have right upper lobe and right middle lobe pneumonia and admission requested.  Assessment & Plan:     CAP (community acquired pneumonia)   Acute on chronic respiratory failure with hypoxia   Tobacco abuse  Acute hypoxemic respiratory failure -Due to community-acquired pneumonia/lobar pneumonia. -Treated with Rocephin and azithromycin and discharge home on oral doxycycline. -Culture data remains negative. -Continue oxygen as tolerated and wean to keep sats above 90%. -CT angiogram of the chest is negative for PE. -Influenza PCR negative. -Plan to send home on oxygen 2L/min -Duonebs ordered with good results and home nebulizer arranged with medications and instructions given to patient.   Pt was strongly  advised not to smoke cigarettes while on oxygen.  She verbalized understanding.    Tobacco abuse -Counseled on cessation Ready to quit: No Counseling given: Yes  Pt was strongly advised not to smoke cigarettes while on oxygen.  She verbalized understanding.    DVT prophylaxis: Lovenox Code Status: Full code Disposition Plan: Home  Discharge Diagnoses:  Principal Problem:   CAP (community acquired pneumonia) Active Problems:   Acute on chronic respiratory failure with hypoxia (HCC)   Tobacco abuse  Discharge Instructions: Discharge Instructions    Call MD for:  difficulty breathing, headache or visual disturbances   Complete by:  As directed    Call MD for:  extreme fatigue   Complete by:  As directed    Call MD for:  persistant dizziness or light-headedness   Complete by:  As directed    Increase activity slowly   Complete by:  As directed      Allergies as of 06/26/2017   No Known Allergies     Medication List    TAKE these medications   doxycycline 100 MG tablet Commonly known as:  VIBRA-TABS Take 1 tablet (100 mg total) by mouth every 12 (twelve) hours for 7 days.   guaiFENesin-dextromethorphan 100-10 MG/5ML syrup Commonly known as:  ROBITUSSIN DM Take 5 mLs by mouth every 4 (four) hours as needed for cough.   ipratropium-albuterol 0.5-2.5 (3) MG/3ML Soln Commonly known as:  DUONEB Take 3 mLs by nebulization every 4 (four) hours as needed (wheezing and shortness of breath, coughing spells).            Durable Medical Equipment  (From admission,  onward)        Start     Ordered   06/26/17 0823  For home use only DME oxygen  Once    Question Answer Comment  Mode or (Route) Nasal cannula   Liters per Minute 3   Frequency Continuous (stationary and portable oxygen unit needed)   Oxygen conserving device Yes   Oxygen delivery system Gas      06/26/17 0822   06/25/17 0859  For home use only DME Nebulizer machine  Once    Question:  Patient needs a  nebulizer to treat with the following condition  Answer:  COPD (chronic obstructive pulmonary disease) (HCC)   06/25/17 0859     Follow-up Information    Mirna Mires, MD. Schedule an appointment as soon as possible for a visit in 1 week(s).   Specialty:  Family Medicine Why:  Hospital Follow Up Contact information: [address] [place] [address] [phone]          No Known Allergies Allergies as of 06/26/2017   No Known Allergies     Medication List    TAKE these medications   doxycycline 100 MG tablet Commonly known as:  VIBRA-TABS Take 1 tablet (100 mg total) by mouth every 12 (twelve) hours for 7 days.   guaiFENesin-dextromethorphan 100-10 MG/5ML syrup Commonly known as:  ROBITUSSIN DM Take 5 mLs by mouth every 4 (four) hours as needed for cough.   ipratropium-albuterol 0.5-2.5 (3) MG/3ML Soln Commonly known as:  DUONEB Take 3 mLs by nebulization every 4 (four) hours as needed (wheezing and shortness of breath, coughing spells).            Durable Medical Equipment  (From admission, onward)        Start     Ordered   06/26/17 0823  For home use only DME oxygen  Once    Question Answer Comment  Mode or (Route) Nasal cannula   Liters per Minute 3   Frequency Continuous (stationary and portable oxygen unit needed)   Oxygen conserving device Yes   Oxygen delivery system Gas      06/26/17 0822   06/25/17 0859  For home use only DME Nebulizer machine  Once    Question:  Patient needs a nebulizer to treat with the following condition  Answer:  COPD (chronic obstructive pulmonary disease) (HCC)   06/25/17 0859      Procedures/Studies: Dg Chest 2 View  Result Date: 06/23/2017 CLINICAL DATA:  Cough and congestion x3 days with body aches. Patient states right sided chest pain with coughing and breathing. Current smoker. EXAM: CHEST  2 VIEW COMPARISON:  Normal cardiac silhouette. There is segmental airspace disease in the RIGHT upper lobe. More  diffuse airspace disease in the RIGHT lower lobe. Obscuration of the heart border on lateral projection. No acute osseous abnormality. FINDINGS: The heart size and mediastinal contours are within normal limits. Both lungs are clear. The visualized skeletal structures are unremarkable. IMPRESSION: 1. RIGHT upper lobe pneumonia. 2. Potential RIGHT middle lobe pneumonia. 3. Followup PA and lateral chest X-ray is recommended in 3-4 weeks following trial of antibiotic therapy to ensure resolution and exclude underlying malignancy. Electronically Signed   By: Genevive Bi M.Jackson.   On: 06/23/2017 11:02   Ct Angio Chest Pe W Or Wo Contrast  Result Date: 06/23/2017 CLINICAL DATA:  Right-sided chest pain and generalized malaise for 3-4 days. EXAM: CT ANGIOGRAPHY CHEST WITH CONTRAST TECHNIQUE: Multidetector CT imaging of the  chest was performed using the standard protocol during bolus administration of intravenous contrast. Multiplanar CT image reconstructions and MIPs were obtained to evaluate the vascular anatomy. CONTRAST:  ISOVUE-370 IOPAMIDOL (ISOVUE-370) INJECTION 76% COMPARISON:  Chest radiograph 06/23/2017 FINDINGS: Cardiovascular: Motion artifact limits the examination. There is good opacification of the central and segmental pulmonary arteries. No focal filling defects. No evidence of significant pulmonary embolus. Mild cardiac enlargement. Small pericardial effusion. Normal caliber thoracic aorta. Great vessel origins are patent. Mediastinum/Nodes: Prominent right hilar lymph node measuring about 10 mm diameter, likely reactive. Esophagus is decompressed. Thyroid gland is unremarkable. Lungs/Pleura: Small bilateral pleural effusions with basilar atelectasis, greater on the right. Airspace disease with focal consolidations in the right upper lung and right middle lung likely representing pneumonia. No pneumothorax. Airways are patent. Upper Abdomen: 1.5 cm diameter cystic structure demonstrated in the  splenic hilum, likely arising from the tail of the pancreas. No acute process demonstrated in the visualized upper abdomen. Musculoskeletal: No chest wall abnormality. No acute or significant osseous findings. Review of the MIP images confirms the above findings. IMPRESSION: 1. No evidence of significant pulmonary embolus. 2. Airspace disease with focal areas of consolidation in the right upper lung and right middle lung likely representing pneumonia. Followup PA and lateral chest X-ray is recommended in 3-4 weeks following appropriate clinical therapy to ensure resolution and exclude underlying malignancy. 3. Small bilateral pleural effusions with basilar atelectasis, greater on the right. 4. Cardiac enlargement with small pericardial effusion. 5. 1.5 cm diameter cystic structure demonstrated in the splenic hilum, likely arising from the tail of the pancreas. Suggest follow-up imaging yearly for 5 years. Electronically Signed   By: Burman Nieves M.Jackson.   On: 06/23/2017 21:27      Subjective: Pt says she feels strong enough to go home.  She has been ambulating in room. She feels better.    Discharge Exam: Vitals:   06/26/17 0510 06/26/17 0728  BP: 136/66   Pulse: 79   Resp: 17   Temp: 98.6 F (37 C)   SpO2: 94% 90%   Vitals:   06/25/17 1946 06/25/17 2118 06/26/17 0510 06/26/17 0728  BP:  (!) 116/59 136/66   Pulse:  98 79   Resp:  17 17   Temp:  97.9 F (36.6 C) 98.6 F (37 C)   TempSrc:  Oral Oral   SpO2: 90% 93% 94% 90%  Weight:      Height:       General: Pt is alert, awake, not in acute distress Cardiovascular: RRR, S1/S2 +, no rubs, no gallops Respiratory: rales and some exp wheezes heard but better air movement. Abdominal: Soft, NT, ND, bowel sounds + Extremities: no edema, no cyanosis   The results of significant diagnostics from this hospitalization (including imaging, microbiology, ancillary and laboratory) are listed below for reference.     Microbiology: Recent  Results (from the past 240 hour(s))  Blood Culture (routine x 2)     Status: None (Preliminary result)   Collection Time: 06/23/17 12:10 PM  Result Value Ref Range Status   Specimen Description BLOOD LEFT ARM  Final   Special Requests   Final    BOTTLES DRAWN AEROBIC AND ANAEROBIC Blood Culture adequate volume   Culture NO GROWTH 3 DAYS  Final   Report Status PENDING  Incomplete  Blood Culture (routine x 2)     Status: None (Preliminary result)   Collection Time: 06/23/17 12:16 PM  Result Value Ref Range Status   Specimen  Description BLOOD RIGHT ARM  Final   Special Requests   Final    BOTTLES DRAWN AEROBIC AND ANAEROBIC Blood Culture adequate volume   Culture NO GROWTH 3 DAYS  Final   Report Status PENDING  Incomplete  Culture, sputum-assessment     Status: None   Collection Time: 06/25/17 11:28 AM  Result Value Ref Range Status   Specimen Description EXPECTORATED SPUTUM  Final   Special Requests NONE  Final   Sputum evaluation   Final    Sputum specimen not acceptable for testing.  Please recollect.   NOTIFIED MAYS J. RN TO RECOLLECT AT 1300 BY THOMPSON S.    Report Status 06/25/2017 FINAL  Final     Labs: BNP (last 3 results) No results for input(s): BNP in the last 8760 hours. Basic Metabolic Panel: Recent Labs  Lab 06/23/17 1023 06/24/17 0503 06/25/17 0443  NA 136 137 138  K 3.7 3.9 3.8  CL 99* 104 104  CO2 23 23 23   GLUCOSE 160* 126* 106*  BUN 24* 13 7  CREATININE 1.10* 0.81 0.75  CALCIUM 8.3* 8.0* 7.8*   Liver Function Tests: No results for input(s): AST, ALT, ALKPHOS, BILITOT, PROT, ALBUMIN in the last 168 hours. No results for input(s): LIPASE, AMYLASE in the last 168 hours. No results for input(s): AMMONIA in the last 168 hours. CBC: Recent Labs  Lab 06/23/17 1023 06/24/17 0503 06/25/17 0443  WBC 11.4* 10.9* 9.2  NEUTROABS 8.8*  --   --   HGB 13.8 12.5 12.1  HCT 43.5 39.6 39.0  MCV 89.0 89.0 88.8  PLT 194 204 213   Cardiac Enzymes: Recent  Labs  Lab 06/23/17 1023  TROPONINI <0.03   BNP: Invalid input(s): POCBNP CBG: No results for input(s): GLUCAP in the last 168 hours. Jackson-Dimer No results for input(s): DDIMER in the last 72 hours. Hgb A1c No results for input(s): HGBA1C in the last 72 hours. Lipid Profile No results for input(s): CHOL, HDL, LDLCALC, TRIG, CHOLHDL, LDLDIRECT in the last 72 hours. Thyroid function studies No results for input(s): TSH, T4TOTAL, T3FREE, THYROIDAB in the last 72 hours.  Invalid input(s): FREET3 Anemia work up No results for input(s): VITAMINB12, FOLATE, FERRITIN, TIBC, IRON, RETICCTPCT in the last 72 hours. Urinalysis No results found for: COLORURINE, APPEARANCEUR, LABSPEC, PHURINE, GLUCOSEU, HGBUR, BILIRUBINUR, KETONESUR, PROTEINUR, UROBILINOGEN, NITRITE, LEUKOCYTESUR Sepsis Labs Invalid input(s): PROCALCITONIN,  WBC,  LACTICIDVEN Microbiology Recent Results (from the past 240 hour(s))  Blood Culture (routine x 2)     Status: None (Preliminary result)   Collection Time: 06/23/17 12:10 PM  Result Value Ref Range Status   Specimen Description BLOOD LEFT ARM  Final   Special Requests   Final    BOTTLES DRAWN AEROBIC AND ANAEROBIC Blood Culture adequate volume   Culture NO GROWTH 3 DAYS  Final   Report Status PENDING  Incomplete  Blood Culture (routine x 2)     Status: None (Preliminary result)   Collection Time: 06/23/17 12:16 PM  Result Value Ref Range Status   Specimen Description BLOOD RIGHT ARM  Final   Special Requests   Final    BOTTLES DRAWN AEROBIC AND ANAEROBIC Blood Culture adequate volume   Culture NO GROWTH 3 DAYS  Final   Report Status PENDING  Incomplete  Culture, sputum-assessment     Status: None   Collection Time: 06/25/17 11:28 AM  Result Value Ref Range Status   Specimen Description EXPECTORATED SPUTUM  Final   Special Requests NONE  Final  Sputum evaluation   Final    Sputum specimen not acceptable for testing.  Please recollect.   NOTIFIED MAYS J. RN TO  RECOLLECT AT 1300 BY THOMPSON S.    Report Status 06/25/2017 FINAL  Final   Time coordinating discharge: 34 minutes  SIGNED:  Standley Dakins, MD  [facility] 06/26/2017, 8:35 AM Pager [phone]  If 7PM-7AM, please contact night-coverage [email or web address] Password [email or web address]

## 2017-06-27 LAB — LEGIONELLA PNEUMOPHILA SEROGP 1 UR AG: L. pneumophila Serogp 1 Ur Ag: NEGATIVE

## 2017-06-28 LAB — CULTURE, BLOOD (ROUTINE X 2)
CULTURE: NO GROWTH
Culture: NO GROWTH
SPECIAL REQUESTS: ADEQUATE
SPECIAL REQUESTS: ADEQUATE

## 2017-06-30 DIAGNOSIS — J189 Pneumonia, unspecified organism: Secondary | ICD-10-CM | POA: Diagnosis not present

## 2017-06-30 DIAGNOSIS — R030 Elevated blood-pressure reading, without diagnosis of hypertension: Secondary | ICD-10-CM | POA: Diagnosis not present

## 2017-06-30 DIAGNOSIS — Z9981 Dependence on supplemental oxygen: Secondary | ICD-10-CM | POA: Diagnosis not present

## 2017-06-30 DIAGNOSIS — R0902 Hypoxemia: Secondary | ICD-10-CM | POA: Diagnosis not present

## 2017-07-07 DIAGNOSIS — Z9981 Dependence on supplemental oxygen: Secondary | ICD-10-CM | POA: Diagnosis not present

## 2017-07-07 DIAGNOSIS — J159 Unspecified bacterial pneumonia: Secondary | ICD-10-CM | POA: Diagnosis not present

## 2017-07-07 DIAGNOSIS — I10 Essential (primary) hypertension: Secondary | ICD-10-CM | POA: Diagnosis not present

## 2017-07-07 DIAGNOSIS — R0902 Hypoxemia: Secondary | ICD-10-CM | POA: Diagnosis not present

## 2017-07-24 DIAGNOSIS — J189 Pneumonia, unspecified organism: Secondary | ICD-10-CM | POA: Diagnosis not present

## 2017-08-04 DIAGNOSIS — J069 Acute upper respiratory infection, unspecified: Secondary | ICD-10-CM | POA: Diagnosis not present

## 2017-08-08 ENCOUNTER — Emergency Department (HOSPITAL_COMMUNITY): Payer: BLUE CROSS/BLUE SHIELD

## 2017-08-08 ENCOUNTER — Inpatient Hospital Stay (HOSPITAL_COMMUNITY)
Admission: EM | Admit: 2017-08-08 | Discharge: 2017-08-11 | DRG: 190 | Disposition: A | Discharge status: Home / Self Care | Payer: BLUE CROSS/BLUE SHIELD | Attending: Internal Medicine | Admitting: Internal Medicine

## 2017-08-08 ENCOUNTER — Encounter (HOSPITAL_COMMUNITY): Payer: Self-pay | Admitting: Emergency Medicine

## 2017-08-08 DIAGNOSIS — I10 Essential (primary) hypertension: Secondary | ICD-10-CM

## 2017-08-08 DIAGNOSIS — E11649 Type 2 diabetes mellitus with hypoglycemia without coma: Secondary | ICD-10-CM | POA: Diagnosis present

## 2017-08-08 DIAGNOSIS — R590 Localized enlarged lymph nodes: Secondary | ICD-10-CM | POA: Diagnosis not present

## 2017-08-08 DIAGNOSIS — J441 Chronic obstructive pulmonary disease with (acute) exacerbation: Principal | ICD-10-CM | POA: Diagnosis present

## 2017-08-08 DIAGNOSIS — J90 Pleural effusion, not elsewhere classified: Secondary | ICD-10-CM | POA: Diagnosis present

## 2017-08-08 DIAGNOSIS — Z79899 Other long term (current) drug therapy: Secondary | ICD-10-CM | POA: Diagnosis not present

## 2017-08-08 DIAGNOSIS — J069 Acute upper respiratory infection, unspecified: Secondary | ICD-10-CM | POA: Diagnosis not present

## 2017-08-08 DIAGNOSIS — Z825 Family history of asthma and other chronic lower respiratory diseases: Secondary | ICD-10-CM

## 2017-08-08 DIAGNOSIS — E860 Dehydration: Secondary | ICD-10-CM | POA: Diagnosis not present

## 2017-08-08 DIAGNOSIS — J189 Pneumonia, unspecified organism: Secondary | ICD-10-CM | POA: Diagnosis not present

## 2017-08-08 DIAGNOSIS — R944 Abnormal results of kidney function studies: Secondary | ICD-10-CM | POA: Diagnosis present

## 2017-08-08 DIAGNOSIS — R918 Other nonspecific abnormal finding of lung field: Secondary | ICD-10-CM | POA: Diagnosis present

## 2017-08-08 DIAGNOSIS — Z716 Tobacco abuse counseling: Secondary | ICD-10-CM | POA: Diagnosis not present

## 2017-08-08 DIAGNOSIS — J9621 Acute and chronic respiratory failure with hypoxia: Secondary | ICD-10-CM | POA: Diagnosis present

## 2017-08-08 DIAGNOSIS — J9601 Acute respiratory failure with hypoxia: Secondary | ICD-10-CM | POA: Insufficient documentation

## 2017-08-08 DIAGNOSIS — Z9981 Dependence on supplemental oxygen: Secondary | ICD-10-CM

## 2017-08-08 DIAGNOSIS — J9600 Acute respiratory failure, unspecified whether with hypoxia or hypercapnia: Secondary | ICD-10-CM | POA: Diagnosis present

## 2017-08-08 DIAGNOSIS — F17210 Nicotine dependence, cigarettes, uncomplicated: Secondary | ICD-10-CM | POA: Diagnosis not present

## 2017-08-08 DIAGNOSIS — J9691 Respiratory failure, unspecified with hypoxia: Secondary | ICD-10-CM | POA: Insufficient documentation

## 2017-08-08 DIAGNOSIS — Z720 Tobacco use: Secondary | ICD-10-CM | POA: Diagnosis not present

## 2017-08-08 DIAGNOSIS — E119 Type 2 diabetes mellitus without complications: Secondary | ICD-10-CM

## 2017-08-08 HISTORY — DX: Tobacco use: Z72.0

## 2017-08-08 HISTORY — DX: Pneumonia, unspecified organism: J18.9

## 2017-08-08 LAB — URINALYSIS, ROUTINE W REFLEX MICROSCOPIC
Bilirubin Urine: NEGATIVE
GLUCOSE, UA: NEGATIVE mg/dL
Hgb urine dipstick: NEGATIVE
Ketones, ur: 5 mg/dL — AB
Leukocytes, UA: NEGATIVE
NITRITE: NEGATIVE
Protein, ur: 30 mg/dL — AB
SPECIFIC GRAVITY, URINE: 1.021 (ref 1.005–1.030)
pH: 5 (ref 5.0–8.0)

## 2017-08-08 LAB — CBC WITH DIFFERENTIAL/PLATELET
BASOS ABS: 0.1 10*3/uL (ref 0.0–0.1)
Basophils Relative: 2 %
EOS ABS: 0 10*3/uL (ref 0.0–0.7)
Eosinophils Relative: 0 %
HCT: 40.4 % (ref 36.0–46.0)
HEMOGLOBIN: 12.8 g/dL (ref 12.0–15.0)
LYMPHS PCT: 39 %
Lymphs Abs: 1.5 10*3/uL (ref 0.7–4.0)
MCH: 27.4 pg (ref 26.0–34.0)
MCHC: 31.7 g/dL (ref 30.0–36.0)
MCV: 86.3 fL (ref 78.0–100.0)
Monocytes Absolute: 0.3 10*3/uL (ref 0.1–1.0)
Monocytes Relative: 8 %
NEUTROS ABS: 1.9 10*3/uL (ref 1.7–7.7)
Neutrophils Relative %: 51 %
PLATELETS: 181 10*3/uL (ref 150–400)
RBC: 4.68 MIL/uL (ref 3.87–5.11)
RDW: 14.9 % (ref 11.5–15.5)
WBC: 3.8 10*3/uL — ABNORMAL LOW (ref 4.0–10.5)

## 2017-08-08 LAB — COMPREHENSIVE METABOLIC PANEL
ALBUMIN: 3.3 g/dL — AB (ref 3.5–5.0)
ALK PHOS: 55 U/L (ref 38–126)
ALT: 19 U/L (ref 14–54)
ANION GAP: 13 (ref 5–15)
AST: 19 U/L (ref 15–41)
BILIRUBIN TOTAL: 1 mg/dL (ref 0.3–1.2)
BUN: 22 mg/dL — ABNORMAL HIGH (ref 6–20)
CALCIUM: 8.1 mg/dL — AB (ref 8.9–10.3)
CO2: 21 mmol/L — ABNORMAL LOW (ref 22–32)
Chloride: 99 mmol/L — ABNORMAL LOW (ref 101–111)
Creatinine, Ser: 0.95 mg/dL (ref 0.44–1.00)
GFR calc Af Amer: >60 mL/min (ref >60)
GFR calc non Af Amer: >60 mL/min (ref >60)
GLUCOSE: 108 mg/dL — AB (ref 65–99)
Potassium: 4 mmol/L (ref 3.5–5.1)
Sodium: 133 mmol/L — ABNORMAL LOW (ref 135–145)
TOTAL PROTEIN: 8 g/dL (ref 6.5–8.1)

## 2017-08-08 LAB — SEDIMENTATION RATE: Sed Rate: 53 mm/hr — ABNORMAL HIGH (ref 0–22)

## 2017-08-08 LAB — I-STAT CG4 LACTIC ACID, ED: Lactic Acid, Venous: 1.06 mmol/L (ref 0.5–1.9)

## 2017-08-08 LAB — TSH: TSH: 1.929 u[IU]/mL (ref 0.350–4.500)

## 2017-08-08 LAB — I-STAT BETA HCG BLOOD, ED (MC, WL, AP ONLY): I-stat hCG, quantitative: <5 m[IU]/mL (ref <5)

## 2017-08-08 LAB — TROPONIN I: Troponin I: <0.03 ng/mL (ref <0.03)

## 2017-08-08 MED ORDER — GUAIFENESIN-CODEINE 100-10 MG/5ML PO SOLN
10.0000 mL | Freq: Four times a day (QID) | ORAL | Status: DC | PRN
Start: 2017-08-08 — End: 2017-08-11
  Administered 2017-08-10: 10 mL via ORAL
  Filled 2017-08-08: qty 10

## 2017-08-08 MED ORDER — SODIUM CHLORIDE 0.9 % IV SOLN
500.0000 mg | INTRAVENOUS | Status: DC
  Administered 2017-08-08 – 2017-08-09 (×2): 500 mg via INTRAVENOUS
  Filled 2017-08-08 (×3): qty 500

## 2017-08-08 MED ORDER — ONDANSETRON HCL 4 MG/2ML IJ SOLN: 4.0000 mg | Freq: Four times a day (QID) | INTRAMUSCULAR | Status: DC | PRN

## 2017-08-08 MED ORDER — SODIUM CHLORIDE 0.9 % IV SOLN
INTRAVENOUS | Status: DC
  Administered 2017-08-08: 21:00:00 via INTRAVENOUS
  Administered 2017-08-09: 75 mL/h via INTRAVENOUS
  Administered 2017-08-10: 10:00:00 via INTRAVENOUS
  Administered 2017-08-10: 75 mL via INTRAVENOUS

## 2017-08-08 MED ORDER — IOPAMIDOL (ISOVUE-370) INJECTION 76%
INTRAVENOUS | Status: AC
  Administered 2017-08-08: 100 mL
  Filled 2017-08-08: qty 100

## 2017-08-08 MED ORDER — SODIUM CHLORIDE 0.9 % IV BOLUS (SEPSIS)
1000.0000 mL | Freq: Once | INTRAVENOUS | Status: AC
  Administered 2017-08-08: 1000 mL via INTRAVENOUS

## 2017-08-08 MED ORDER — ENOXAPARIN SODIUM 40 MG/0.4ML ~~LOC~~ SOLN
40.0000 mg | SUBCUTANEOUS | Status: DC
  Administered 2017-08-08 – 2017-08-10 (×3): 40 mg via SUBCUTANEOUS
  Filled 2017-08-08 (×3): qty 0.4

## 2017-08-08 MED ORDER — IPRATROPIUM-ALBUTEROL 0.5-2.5 (3) MG/3ML IN SOLN
3.0000 mL | Freq: Four times a day (QID) | RESPIRATORY_TRACT | Status: DC
  Administered 2017-08-08 – 2017-08-09 (×2): 3 mL via RESPIRATORY_TRACT
  Filled 2017-08-08 (×3): qty 3

## 2017-08-08 MED ORDER — IPRATROPIUM-ALBUTEROL 0.5-2.5 (3) MG/3ML IN SOLN
3.0000 mL | Freq: Once | RESPIRATORY_TRACT | Status: AC
  Administered 2017-08-08: 3 mL via RESPIRATORY_TRACT
  Filled 2017-08-08: qty 3

## 2017-08-08 MED ORDER — ACETAMINOPHEN 325 MG PO TABS: 650.0000 mg | ORAL_TABLET | Freq: Four times a day (QID) | ORAL | Status: DC | PRN

## 2017-08-08 MED ORDER — AMLODIPINE BESYLATE 5 MG PO TABS
5.0000 mg | ORAL_TABLET | Freq: Every day | ORAL | Status: DC
  Administered 2017-08-08 – 2017-08-10 (×3): 5 mg via ORAL
  Filled 2017-08-08 (×4): qty 1

## 2017-08-08 MED ORDER — METHYLPREDNISOLONE SODIUM SUCC 125 MG IJ SOLR
125.0000 mg | Freq: Once | INTRAMUSCULAR | Status: AC
  Administered 2017-08-08: 125 mg via INTRAVENOUS
  Filled 2017-08-08: qty 2

## 2017-08-08 MED ORDER — ONDANSETRON HCL 4 MG PO TABS: 4.0000 mg | ORAL_TABLET | Freq: Four times a day (QID) | ORAL | Status: DC | PRN

## 2017-08-08 MED ORDER — ACETAMINOPHEN 650 MG RE SUPP: 650.0000 mg | Freq: Four times a day (QID) | RECTAL | Status: DC | PRN

## 2017-08-08 NOTE — ED Triage Notes (Signed)
Pt reports about 1 week of weakness and lethargy, saw MD Monday has PRN O2. Arrived on 2L and was 85%, increased to 4L with improvement to 96%. Recent dx of pneumonia, was in hospital for it. Afebrile.

## 2017-08-08 NOTE — ED Notes (Signed)
Pt ambulated while on pulse oximetry HR remained in the 70-75 range and O2 sats between 92-95 on 4L of O2 via [place].

## 2017-08-08 NOTE — ED Notes (Signed)
Called for lobby  X 2.

## 2017-08-08 NOTE — ED Provider Notes (Signed)
[facility] Provider Note   CSN: [id number] Arrival date & time: 08/08/17  1223     History   Chief Complaint Chief Complaint  Patient presents with  . Weakness    HPI Andrew AuJuanita D Antwine is a 62 y.o. female with history of community-acquired pneumonia and acute on chronic respiratory failure as well as tobacco abuse presents today for evaluation of acute onset, constant generalized weakness for 1 week.  She was admitted for a community-acquired pneumonia with hypoxia in January.  She is typically on 2 L via nasal cannula at home as needed.  She denies significant shortness of breath or chest pain but notes that she feels fatigued constantly.  No aggravating or alleviating factors noted.  She is a current smoker, approximately half a pack to a pack of cigarettes daily and has been smoking since she was 62 years old.  She was found to be hypoxic initially at 85% on 2 L with improvement when placed on 4 L/min via nasal cannula.  She was seen and evaluated by her primary care physician Dr. Loleta ChanceHill earlier this week and was discharged with Tamiflu for which she has had 6 doses.  She denies fevers.  No recent travel or surgeries, no hemoptysis, no prior history of DVT or PE, and she is not on estrogen or placement therapy.  The history is provided by the patient.    Past Medical History:  Diagnosis Date  . Pneumonia     Patient Active Problem List   Diagnosis Date Noted  . CAP (community acquired pneumonia) 06/23/2017  . Acute on chronic respiratory failure with hypoxia (HCC) 06/23/2017  . Tobacco abuse 06/23/2017    No past surgical history on file.  OB History    No data available       Home Medications    Prior to Admission medications   Medication Sig Start Date End Date Taking? Authorizing Provider  amLODipine (NORVASC) 5 MG tablet Take 5 mg by mouth daily. 07/07/17  Yes [provider]  ipratropium-albuterol (DUONEB) 0.5-2.5  (3) MG/3ML SOLN Take 3 mLs by nebulization every 4 (four) hours as needed (wheezing and shortness of breath, coughing spells). 06/26/17  Yes Johnson, Clanford L, MD  oseltamivir (TAMIFLU) 75 MG capsule Take 75 mg by mouth 2 (two) times daily. 08/05/17  Yes [provider]  OXYGEN Inhale 2 L into the lungs continuous.   Yes [provider]  VIRTUSSIN A/C 100-10 MG/5ML syrup Take 10 mLs by mouth every 6 (six) hours as needed. 08/05/17  Yes [provider]  guaiFENesin-dextromethorphan (ROBITUSSIN DM) 100-10 MG/5ML syrup Take 5 mLs by mouth every 4 (four) hours as needed for cough. Patient not taking: Reported on 08/08/2017 06/26/17   Cleora FleetJohnson, Clanford L, MD    Family History No family history on file.  Social History Social History   Tobacco Use  . Smoking status: Current Every Day Smoker    Packs/day: 1.00    Types: Cigarettes  . Smokeless tobacco: Never Used  Substance Use Topics  . Alcohol use: No  . Drug use: No     Allergies   Patient has no known allergies.   Review of Systems Review of Systems  Constitutional: Positive for fatigue. Negative for chills and fever.  Respiratory: Positive for shortness of breath.   Cardiovascular: Negative for chest pain.  Gastrointestinal: Negative for abdominal pain, nausea and vomiting.  Neurological: Positive for weakness. Negative for syncope.  All other systems reviewed and  are negative.    Physical Exam Updated Vital Signs BP (!) 118/53   Pulse 67   Temp 98.7 F (37.1 C) (Oral)   Resp 20   SpO2 97%   Physical Exam  Constitutional: She is oriented to person, place, and time. She appears well-developed and well-nourished. No distress.  Resting comfortably in bed, appears somewhat tired  HENT:  Head: Normocephalic and atraumatic.  Eyes: Conjunctivae and EOM are normal. Pupils are equal, round, and reactive to light. Right eye exhibits no discharge. Left eye exhibits no discharge.  Neck: Normal range of  motion. Neck supple. No JVD present. No tracheal deviation present.  Cardiovascular: Normal rate, regular rhythm, normal heart sounds and intact distal pulses.  2+ radial and DP/PT pulses bl, negative Homan's bl, no LE edema  Pulmonary/Chest: Effort normal. She exhibits no tenderness.  Diminished breath sounds globally, heart sounds distant.  Equal rise and fall of chest, speaking in full sentences on 4 L via nasal cannula  Abdominal: She exhibits no distension.  Musculoskeletal: Normal range of motion. She exhibits no edema or tenderness.  Neurological: She is alert and oriented to person, place, and time. No cranial nerve deficit or sensory deficit. She exhibits normal muscle tone.  Skin: Skin is warm and dry. No erythema.  Psychiatric: She has a normal mood and affect. Her behavior is normal.  Nursing note and vitals reviewed.    ED Treatments / Results  Labs (all labs ordered are listed, but only abnormal results are displayed) Labs Reviewed  COMPREHENSIVE METABOLIC PANEL - Abnormal; Notable for the following components:      Result Value   Sodium 133 (*)    Chloride 99 (*)    CO2 21 (*)    Glucose, Bld 108 (*)    BUN 22 (*)    Calcium 8.1 (*)    Albumin 3.3 (*)    All other components within normal limits  CBC WITH DIFFERENTIAL/PLATELET - Abnormal; Notable for the following components:   WBC 3.8 (*)    All other components within normal limits  URINALYSIS, ROUTINE W REFLEX MICROSCOPIC - Abnormal; Notable for the following components:   Color, Urine AMBER (*)    Ketones, ur 5 (*)    Protein, ur 30 (*)    Bacteria, UA RARE (*)    Squamous Epithelial / LPF 0-5 (*)    All other components within normal limits  I-STAT CG4 LACTIC ACID, ED  I-STAT BETA HCG BLOOD, ED ([facility], [facility], [facility] ONLY)    EKG  EKG Interpretation None       Radiology Dg Chest 2 View  Result Date: 08/08/2017 CLINICAL DATA:  62 year old female with shortness weakness of breath. Weakness for 1 week. EXAM:  CHEST - 2 VIEW COMPARISON:  Chest CTA 06/23/2017 and earlier. FINDINGS: Virtually resolved confluent pulmonary opacity in the right upper lobe, right middle lobe, and left lower lobe demonstrated on 06/23/2017. Stable lung volumes. Continued diffuse bilateral pulmonary interstitial opacity, but this appears chronic. Stable cardiac size and mediastinal contours. Visualized tracheal air column is within normal limits. No pneumothorax. No pleural effusion. No new confluent pulmonary opacity. No acute osseous abnormality identified. Negative visible bowel gas pattern. IMPRESSION: 1. Bilateral pneumonia seen in January appears resolved. 2. Underlying chronic lung disease. No superimposed acute findings are identified. Electronically Signed   By: Odessa Fleming M.D.   On: 08/08/2017 13:42   Ct Angio Chest Pe W And/or Wo Contrast  Result Date: 08/08/2017 CLINICAL DATA:  Generalized weakness  and lethargy x1 week. Recent diagnosis pneumonia. Current smoker. EXAM: CT ANGIOGRAPHY CHEST WITH CONTRAST TECHNIQUE: Multidetector CT imaging of the chest was performed using the standard protocol during bolus administration of intravenous contrast. Multiplanar CT image reconstructions and MIPs were obtained to evaluate the vascular anatomy. CONTRAST:  70 cc ISOVUE-370 IOPAMIDOL (ISOVUE-370) INJECTION 76% COMPARISON:  CXR 08/08/2017 and chest CT 06/23/2017 FINDINGS: Cardiovascular: Normal branch pattern of the great vessels. Minimal thoracic aortic atherosclerosis without aneurysm or dissection. Mild ectasia of the ascending thoracic aorta to 3.6 cm. Good opacification of the pulmonary arteries without acute pulmonary embolus. Stable cardiomegaly with small pericardial effusion. Mediastinum/Nodes: Stable right hilar lymphadenopathy likely reactive given right-sided pneumonic consolidations. The largest are currently 13 mm short axis. Patent trachea and mainstem bronchi. Unremarkable CT appearance of the esophagus. Lungs/Pleura: Partial  resolution of right upper and middle lobe pneumonia in the setting of centrilobular emphysema. Less masslike appearance and consolidation in the right upper lobe since prior. Near complete clearing of right-sided small pleural effusion and left basilar atelectasis. Minimal subpleural atelectasis is seen at the right lung base. Faint superior segment right upper lobe airspace opacities with tree-in-bud configuration are new since prior associated with peribronchial thickening. Mild peribronchial thickening is also seen on the left. Upper Abdomen: No acute abnormality. Musculoskeletal: No chest wall abnormality. No acute or significant osseous findings. Review of the MIP images confirms the above findings. IMPRESSION: 1. Clearing of small right effusion and partial clearing of masslike opacity in the right upper lobe though still present. Findings likely represent stigmata of pneumonia with reactive right hilar lymphadenopathy redemonstrated. Follow-up to complete resolution however is recommended to exclude neoplasm. Repeat imaging in 2-3 months is suggested unless clinical situation warrants an earlier return. 2. Bilateral peribronchial thickening with tiny tree-in-bud opacities, new in the right lower lobe superior segment that likely reflect stigmata of bronchiolitis. 3. Stable cardiomegaly.  No acute pulmonary embolus. Aortic Atherosclerosis (ICD10-I70.0) and Emphysema (ICD10-J43.9). Electronically Signed   By: Tollie Eth M.D.   On: 08/08/2017 18:24    Procedures Procedures (including critical care time)  Medications Ordered in ED Medications  sodium chloride 0.9 % bolus 1,000 mL (1,000 mLs Intravenous New Bag/Given 08/08/17 1745)  iopamidol (ISOVUE-370) 76 % injection (100 mLs  Contrast Given 08/08/17 1753)  ipratropium-albuterol (DUONEB) 0.5-2.5 (3) MG/3ML nebulizer solution 3 mL (3 mLs Nebulization Given 08/08/17 1835)     Initial Impression / Assessment and Plan / ED Course  I have reviewed the  triage vital signs and the nursing notes.  Pertinent labs & imaging results that were available during my care of the patient were reviewed by me and considered in my medical decision making (see chart for details).     Patient presents with 1 week of fatigue.  She is afebrile, initially hypoxic at 85% on 2 L via nasal cannula which is her home O2 requirement.  She did have improvement when increased to 4 L via nasal cannula.  No leukocytosis, she has mild hyponatremia and hypochloremia but otherwise lab work is unremarkable.  UA is not concerning for UTI or nephrolithiasis.  Lactate is within normal limits.  Chest x-ray shows resolution of bilateral pneumonia but does show underlying chronic lung disease.  With ongoing symptoms and recent admission for a pneumonia with acute respiratory failure, we will obtain a CTA to rule out further abnormalities.  7:24 PM CTA shows changes that likely represent stigmata of pneumonia with reactive hilar lymphadenopathy as well as bilateral peribronchial thickening suggesting stigmata  of bronchiolitis.  She had very mild improvement with a breathing treatment and steroids, still has diminished breath sounds bilaterally.  She was ambulated on 4 L via nasal cannula and was stable, however her home O2 requirement is 2 L via nasal cannula.  Spoke with Dr. Katrinka Blazing with [facility] who agrees to assume care of patient and bring her into the hospital for further evaluation and management.    Final Clinical Impressions(s) / ED Diagnoses   Final diagnoses:  COPD exacerbation [facility])    ED Discharge Orders    None       Bennye Alm 08/08/17 2214    Gerhard Munch, MD 08/09/17 [phone]

## 2017-08-08 NOTE — ED Notes (Signed)
Patient transported to CT 

## 2017-08-08 NOTE — H&P (Addendum)
History and Physical    Rita Jackson BTD:[id number] DOB: Sep 23, 1955 DOA: 08/08/2017  Referring MD/NP/PA: Rodell Perna, PA-C PCP: Iona Beard, MD  Patient coming from: home via EMS  Chief Complaint: Generalized weakness  I have personally briefly reviewed patient's old medical records in [place]   HPI: Rita Jackson is a 62 y.o. female with medical history significant of COPD on 2 L of nasal cannula oxygen as needed, and tobacco abuse; who resents with complaints of generalized weakness and fatigue over the last week. Recently hospitalized at [facility] from 1/28 - 1/31, diagnosed with acute hypoxic respiratory failure secondary to community-acquired pneumonia. she was treated with Rocephin and azithromycin switched to doxycycline at discharge.  Patient notes that she previously was not on oxygen prior to the this hospitalization.  She had been using oxygen as needed since leaving the hospital.  Since leaving the hospital she reportedly felt fine initially better and had improvement of her cough.  However, when she started smoking again notes having a dry persistent cough over the last few weeks.  Denies having any significant fever, chills, nausea, vomiting, abdominal pain, dysuria, diarrhea, shortness of breath, or wheezing. Admits to having recent sick contacts including her grandchild with some viral illness with high fever. Patient was seen earlier this week  by her PCP and started on Tamiflu, but reports that she was never formally tested.   ED Course: Upon admission into the emergency department patient was noted to be afebrile, respirations 20-22, blood pressure 118/53-121/69, O2 saturations 85% on home 2 L nasal cannula oxygen, and improved to 97% on 4 L.  Labs revealed WBC 3.8, sodium 133, BUN 22, creatinine 0.95, and lactic acid 1.06.  Urinalysis was clear signs of infection.  Chest x-ray showed resolution of previously seen pneumonia.  CT angiogram of the chest showed  clearing of previous findings except for the masslike opacity in the right upper lobe and signs of bronchiolitis.  She was given 1 L of normal saline IV fluids and albuterol breathing treatment  Review of Systems  Constitutional: Positive for malaise/fatigue. Negative for chills and fever.  HENT: Negative for hearing loss and nosebleeds.   Eyes: Negative for pain and discharge.  Respiratory: Positive for cough and shortness of breath. Negative for sputum production.   Cardiovascular: Negative for chest pain and leg swelling.  Gastrointestinal: Negative for abdominal pain, nausea and vomiting.  Genitourinary: Negative for dysuria and hematuria.  Musculoskeletal: Negative for back pain and myalgias.  Skin: Negative for itching and rash.  Neurological: Positive for weakness. Negative for focal weakness and loss of consciousness.  Psychiatric/Behavioral: Negative for memory loss and substance abuse.    Past Medical History:  Diagnosis Date  . Pneumonia     History reviewed. No pertinent surgical history.   reports that she has been smoking cigarettes.  She has been smoking about 1.00 pack per day. she has never used smokeless tobacco. She reports that she does not drink alcohol or use drugs.  No Known Allergies  Family History  Problem Relation Age of Onset  . COPD Sister     Prior to Admission medications   Medication Sig Start Date End Date Taking? Authorizing Provider  amLODipine (NORVASC) 5 MG tablet Take 5 mg by mouth daily. 07/07/17  Yes [provider]  ipratropium-albuterol (DUONEB) 0.5-2.5 (3) MG/3ML SOLN Take 3 mLs by nebulization every 4 (four) hours as needed (wheezing and shortness of breath, coughing spells). 06/26/17  Yes Johnson, Clanford L,  MD  oseltamivir (TAMIFLU) 75 MG capsule Take 75 mg by mouth 2 (two) times daily. 08/05/17  Yes [provider]  OXYGEN Inhale 2 L into the lungs continuous.   Yes [provider]  VIRTUSSIN A/C 100-10  MG/5ML syrup Take 10 mLs by mouth every 6 (six) hours as needed. 08/05/17  Yes [provider]  guaiFENesin-dextromethorphan (ROBITUSSIN DM) 100-10 MG/5ML syrup Take 5 mLs by mouth every 4 (four) hours as needed for cough. Patient not taking: Reported on 08/08/2017 06/26/17   Murlean Iba, MD    Physical Exam:  Constitutional: Fatigued appearing older female who is otherwise in no acute distress  Vitals:   08/08/17 1259 08/08/17 1300 08/08/17 1558  BP: 121/69  (!) 118/53  Pulse: 79  67  Resp: (!) 22  20  Temp: 98.7 F (37.1 C)    TempSrc: Oral    SpO2: (!) 85% (!) 89% 97%   Eyes: PERRL, lids and conjunctivae normal ENMT: Mucous membranes are dry. Posterior pharynx clear of any exudate or lesions.Normal dentition.  Neck: normal, supple, no masses, no thyromegaly Respiratory: Decreased overall aeration with mild expiratory wheeze appreciated on auscultation.  Patient able to talk in relatively normal sentences. Cardiovascular: Regular rate and rhythm, no murmurs / rubs / gallops. No extremity edema. 2+ pedal pulses. No carotid bruits.  Abdomen: no tenderness, no masses palpated. No hepatosplenomegaly. Bowel sounds positive.  Musculoskeletal: no clubbing / cyanosis. No joint deformity upper and lower extremities. Good ROM, no contractures. Normal muscle tone.  Skin: Some mild skin tenting noted.  No rashes, lesions, ulcers. No induration Neurologic: CN 2-12 grossly intact. Sensation intact, DTR normal. Strength 5/5 in all 4.  Psychiatric: Normal judgment and insight. Alert and oriented x 3. Normal mood.     Labs on Admission: I have personally reviewed following labs and imaging studies  CBC: Recent Labs  Lab 08/08/17 1307  WBC 3.8*  NEUTROABS 1.9  HGB 12.8  HCT 40.4  MCV 86.3  PLT 086   Basic Metabolic Panel: Recent Labs  Lab 08/08/17 1307  NA 133*  K 4.0  CL 99*  CO2 21*  GLUCOSE 108*  BUN 22*  CREATININE 0.95  CALCIUM 8.1*   GFR: CrCl cannot be  calculated (Unknown ideal weight.). Liver Function Tests: Recent Labs  Lab 08/08/17 1307  AST 19  ALT 19  ALKPHOS 55  BILITOT 1.0  PROT 8.0  ALBUMIN 3.3*   No results for input(s): LIPASE, AMYLASE in the last 168 hours. No results for input(s): AMMONIA in the last 168 hours. Coagulation Profile: No results for input(s): INR, PROTIME in the last 168 hours. Cardiac Enzymes: No results for input(s): CKTOTAL, CKMB, CKMBINDEX, TROPONINI in the last 168 hours. BNP (last 3 results) No results for input(s): PROBNP in the last 8760 hours. HbA1C: No results for input(s): HGBA1C in the last 72 hours. CBG: No results for input(s): GLUCAP in the last 168 hours. Lipid Profile: No results for input(s): CHOL, HDL, LDLCALC, TRIG, CHOLHDL, LDLDIRECT in the last 72 hours. Thyroid Function Tests: No results for input(s): TSH, T4TOTAL, FREET4, T3FREE, THYROIDAB in the last 72 hours. Anemia Panel: No results for input(s): VITAMINB12, FOLATE, FERRITIN, TIBC, IRON, RETICCTPCT in the last 72 hours. Urine analysis:    Component Value Date/Time   COLORURINE AMBER (A) 08/08/2017 1718   APPEARANCEUR CLEAR 08/08/2017 1718   LABSPEC 1.021 08/08/2017 1718   PHURINE 5.0 08/08/2017 1718   GLUCOSEU NEGATIVE 08/08/2017 1718   HGBUR NEGATIVE 08/08/2017 1718  BILIRUBINUR NEGATIVE 08/08/2017 1718   KETONESUR 5 (A) 08/08/2017 1718   PROTEINUR 30 (A) 08/08/2017 1718   NITRITE NEGATIVE 08/08/2017 1718   LEUKOCYTESUR NEGATIVE 08/08/2017 1718   Sepsis Labs: No results found for this or any previous visit (from the past 240 hour(s)).   Radiological Exams on Admission: Dg Chest 2 View  Result Date: 08/08/2017 CLINICAL DATA:  62 year old female with shortness weakness of breath. Weakness for 1 week. EXAM: CHEST - 2 VIEW COMPARISON:  Chest CTA 06/23/2017 and earlier. FINDINGS: Virtually resolved confluent pulmonary opacity in the right upper lobe, right middle lobe, and left lower lobe demonstrated on  06/23/2017. Stable lung volumes. Continued diffuse bilateral pulmonary interstitial opacity, but this appears chronic. Stable cardiac size and mediastinal contours. Visualized tracheal air column is within normal limits. No pneumothorax. No pleural effusion. No new confluent pulmonary opacity. No acute osseous abnormality identified. Negative visible bowel gas pattern. IMPRESSION: 1. Bilateral pneumonia seen in January appears resolved. 2. Underlying chronic lung disease. No superimposed acute findings are identified. Electronically Signed   By: Genevie Ann M.D.   On: 08/08/2017 13:42   Ct Angio Chest Pe W And/or Wo Contrast  Result Date: 08/08/2017 CLINICAL DATA:  Generalized weakness and lethargy x1 week. Recent diagnosis pneumonia. Current smoker. EXAM: CT ANGIOGRAPHY CHEST WITH CONTRAST TECHNIQUE: Multidetector CT imaging of the chest was performed using the standard protocol during bolus administration of intravenous contrast. Multiplanar CT image reconstructions and MIPs were obtained to evaluate the vascular anatomy. CONTRAST:  70 cc ISOVUE-370 IOPAMIDOL (ISOVUE-370) INJECTION 76% COMPARISON:  CXR 08/08/2017 and chest CT 06/23/2017 FINDINGS: Cardiovascular: Normal branch pattern of the great vessels. Minimal thoracic aortic atherosclerosis without aneurysm or dissection. Mild ectasia of the ascending thoracic aorta to 3.6 cm. Good opacification of the pulmonary arteries without acute pulmonary embolus. Stable cardiomegaly with small pericardial effusion. Mediastinum/Nodes: Stable right hilar lymphadenopathy likely reactive given right-sided pneumonic consolidations. The largest are currently 13 mm short axis. Patent trachea and mainstem bronchi. Unremarkable CT appearance of the esophagus. Lungs/Pleura: Partial resolution of right upper and middle lobe pneumonia in the setting of centrilobular emphysema. Less masslike appearance and consolidation in the right upper lobe since prior. Near complete clearing of  right-sided small pleural effusion and left basilar atelectasis. Minimal subpleural atelectasis is seen at the right lung base. Faint superior segment right upper lobe airspace opacities with tree-in-bud configuration are new since prior associated with peribronchial thickening. Mild peribronchial thickening is also seen on the left. Upper Abdomen: No acute abnormality. Musculoskeletal: No chest wall abnormality. No acute or significant osseous findings. Review of the MIP images confirms the above findings. IMPRESSION: 1. Clearing of small right effusion and partial clearing of masslike opacity in the right upper lobe though still present. Findings likely represent stigmata of pneumonia with reactive right hilar lymphadenopathy redemonstrated. Follow-up to complete resolution however is recommended to exclude neoplasm. Repeat imaging in 2-3 months is suggested unless clinical situation warrants an earlier return. 2. Bilateral peribronchial thickening with tiny tree-in-bud opacities, new in the right lower lobe superior segment that likely reflect stigmata of bronchiolitis. 3. Stable cardiomegaly.  No acute pulmonary embolus. Aortic Atherosclerosis (ICD10-I70.0) and Emphysema (ICD10-J43.9). Electronically Signed   By: Ashley Royalty M.D.   On: 08/08/2017 18:24    EKG: Independently reviewed.  Normal sinus rhythm at 78 bpm  Assessment/Plan Respiratory failure with hypoxia, suspected COPD exacerbation: Acute on chronic.  Patient presents from home with complaints of worsening fatigue O2 saturations noted to be as low  as 85% on home 2 L.  Imaging studies reveal signs of possible bronchiolitis and partial clearing of right upper lobe mass.  - Admit to a telemetry bed - Continuous pulse oximetry with nasal cannula oxygen - Add on pro calcitonin, CRP, ESR - Check sputum, respiratory virus panel, strep, Legionella studies - Add on troponin - 125 mg of Solu-Medrol IV - Azithromycin IV - DuoNeb breathing treatments  4 times daily   Dehydration patient reports poor p.o. intake seen to have elevated BUN to creatinine ratio.:  - Gentle IV fluids overnight  Generalized fatigue: Acute.  Suspect symptoms could be coming from patient being dehydrated and/or hypoxia. - add-on TSH  Right upper lobe mass: Since previous pneumonia patient found to have partial clearing of right upper lobe mass. - needs follow-up likely with with a Pulmonologist and CT to ensure complete resolution of right upper lobe mass.  Essential hypertension - Continue amlodipine  Tobacco abuse: Patient reports still continuing to smoke intermittently. - Counseled on the need of cessation of tobacco use  DVT prophylaxis:  lovenox   Code Status: Full  Family Communication: Discussed plan of care with patient and family present at bedside Disposition Plan: Discharge home once medically stable Consults called: None Admission status: Observation  Norval Morton MD [facility] Pager [phone]   If 7PM-7AM, please contact night-coverage [email or web address] Password [facility]  08/08/2017, 7:25 PM

## 2017-08-08 NOTE — ED Notes (Signed)
Pt did not answer when called for room 

## 2017-08-08 NOTE — ED Notes (Signed)
Saa, phlebotomist at the bedside

## 2017-08-09 ENCOUNTER — Encounter (HOSPITAL_COMMUNITY): Payer: Self-pay | Admitting: *Deleted

## 2017-08-09 ENCOUNTER — Other Ambulatory Visit: Payer: Self-pay

## 2017-08-09 DIAGNOSIS — I10 Essential (primary) hypertension: Secondary | ICD-10-CM | POA: Diagnosis present

## 2017-08-09 DIAGNOSIS — R918 Other nonspecific abnormal finding of lung field: Secondary | ICD-10-CM | POA: Diagnosis present

## 2017-08-09 DIAGNOSIS — E11649 Type 2 diabetes mellitus with hypoglycemia without coma: Secondary | ICD-10-CM | POA: Diagnosis present

## 2017-08-09 DIAGNOSIS — R590 Localized enlarged lymph nodes: Secondary | ICD-10-CM | POA: Diagnosis present

## 2017-08-09 DIAGNOSIS — F17210 Nicotine dependence, cigarettes, uncomplicated: Secondary | ICD-10-CM | POA: Diagnosis present

## 2017-08-09 DIAGNOSIS — Z716 Tobacco abuse counseling: Secondary | ICD-10-CM | POA: Diagnosis not present

## 2017-08-09 DIAGNOSIS — Z9981 Dependence on supplemental oxygen: Secondary | ICD-10-CM | POA: Diagnosis not present

## 2017-08-09 DIAGNOSIS — J90 Pleural effusion, not elsewhere classified: Secondary | ICD-10-CM | POA: Diagnosis present

## 2017-08-09 DIAGNOSIS — R944 Abnormal results of kidney function studies: Secondary | ICD-10-CM | POA: Diagnosis present

## 2017-08-09 DIAGNOSIS — Z825 Family history of asthma and other chronic lower respiratory diseases: Secondary | ICD-10-CM | POA: Diagnosis not present

## 2017-08-09 DIAGNOSIS — J9600 Acute respiratory failure, unspecified whether with hypoxia or hypercapnia: Secondary | ICD-10-CM | POA: Diagnosis present

## 2017-08-09 DIAGNOSIS — Z79899 Other long term (current) drug therapy: Secondary | ICD-10-CM | POA: Diagnosis not present

## 2017-08-09 DIAGNOSIS — E860 Dehydration: Secondary | ICD-10-CM | POA: Diagnosis present

## 2017-08-09 DIAGNOSIS — J441 Chronic obstructive pulmonary disease with (acute) exacerbation: Secondary | ICD-10-CM | POA: Diagnosis present

## 2017-08-09 DIAGNOSIS — J069 Acute upper respiratory infection, unspecified: Secondary | ICD-10-CM | POA: Diagnosis present

## 2017-08-09 DIAGNOSIS — J9621 Acute and chronic respiratory failure with hypoxia: Secondary | ICD-10-CM | POA: Diagnosis not present

## 2017-08-09 LAB — BASIC METABOLIC PANEL
Anion gap: 9 (ref 5–15)
BUN: 17 mg/dL (ref 6–20)
CHLORIDE: 104 mmol/L (ref 101–111)
CO2: 22 mmol/L (ref 22–32)
CREATININE: 0.86 mg/dL (ref 0.44–1.00)
Calcium: 7.9 mg/dL — ABNORMAL LOW (ref 8.9–10.3)
GFR calc non Af Amer: >60 mL/min (ref >60)
Glucose, Bld: 244 mg/dL — ABNORMAL HIGH (ref 65–99)
POTASSIUM: 4.5 mmol/L (ref 3.5–5.1)
SODIUM: 135 mmol/L (ref 135–145)

## 2017-08-09 LAB — RESPIRATORY PANEL BY PCR
Adenovirus: NOT DETECTED
BORDETELLA PERTUSSIS-RVPCR: NOT DETECTED
Chlamydophila pneumoniae: NOT DETECTED
Coronavirus 229E: NOT DETECTED
Coronavirus HKU1: NOT DETECTED
Coronavirus NL63: NOT DETECTED
Coronavirus OC43: NOT DETECTED
INFLUENZA A-RVPPCR: NOT DETECTED
INFLUENZA B-RVPPCR: NOT DETECTED
METAPNEUMOVIRUS-RVPPCR: DETECTED — AB
Mycoplasma pneumoniae: NOT DETECTED
PARAINFLUENZA VIRUS 1-RVPPCR: NOT DETECTED
PARAINFLUENZA VIRUS 2-RVPPCR: NOT DETECTED
PARAINFLUENZA VIRUS 3-RVPPCR: NOT DETECTED
PARAINFLUENZA VIRUS 4-RVPPCR: NOT DETECTED
RESPIRATORY SYNCYTIAL VIRUS-RVPPCR: NOT DETECTED
RHINOVIRUS / ENTEROVIRUS - RVPPCR: NOT DETECTED

## 2017-08-09 LAB — CBC
HCT: 39.8 % (ref 36.0–46.0)
HEMOGLOBIN: 12.9 g/dL (ref 12.0–15.0)
MCH: 27.7 pg (ref 26.0–34.0)
MCHC: 32.4 g/dL (ref 30.0–36.0)
MCV: 85.6 fL (ref 78.0–100.0)
PLATELETS: 167 10*3/uL (ref 150–400)
RBC: 4.65 MIL/uL (ref 3.87–5.11)
RDW: 14.4 % (ref 11.5–15.5)
WBC: 3.1 10*3/uL — ABNORMAL LOW (ref 4.0–10.5)

## 2017-08-09 LAB — MRSA PCR SCREENING: MRSA BY PCR: POSITIVE — AB

## 2017-08-09 LAB — GLUCOSE, CAPILLARY
GLUCOSE-CAPILLARY: 243 mg/dL — AB (ref 65–99)
Glucose-Capillary: 253 mg/dL — ABNORMAL HIGH (ref 65–99)
Glucose-Capillary: 232 mg/dL — ABNORMAL HIGH (ref 65–99)

## 2017-08-09 LAB — STREP PNEUMONIAE URINARY ANTIGEN: STREP PNEUMO URINARY ANTIGEN: NEGATIVE

## 2017-08-09 LAB — PROCALCITONIN

## 2017-08-09 MED ORDER — CHLORHEXIDINE GLUCONATE CLOTH 2 % EX PADS: 6.0000 | MEDICATED_PAD | Freq: Every day | CUTANEOUS | Status: DC

## 2017-08-09 MED ORDER — METHYLPREDNISOLONE SODIUM SUCC 125 MG IJ SOLR
80.0000 mg | Freq: Four times a day (QID) | INTRAMUSCULAR | Status: DC
  Administered 2017-08-09 – 2017-08-11 (×9): 80 mg via INTRAVENOUS
  Filled 2017-08-09 (×9): qty 2

## 2017-08-09 MED ORDER — SODIUM CHLORIDE 0.9 % IV SOLN
1.0000 g | Freq: Once | INTRAVENOUS | Status: AC
  Administered 2017-08-09: 1 g via INTRAVENOUS
  Filled 2017-08-09: qty 10

## 2017-08-09 MED ORDER — MUPIROCIN 2 % EX OINT
1.0000 "application " | TOPICAL_OINTMENT | Freq: Two times a day (BID) | CUTANEOUS | Status: DC
  Administered 2017-08-09 – 2017-08-11 (×5): 1 via NASAL
  Filled 2017-08-09: qty 22

## 2017-08-09 MED ORDER — IPRATROPIUM-ALBUTEROL 0.5-2.5 (3) MG/3ML IN SOLN: 3.0000 mL | RESPIRATORY_TRACT | Status: DC | PRN

## 2017-08-09 MED ORDER — NICOTINE 14 MG/24HR TD PT24
14.0000 mg | MEDICATED_PATCH | Freq: Every day | TRANSDERMAL | Status: DC
  Administered 2017-08-09 – 2017-08-11 (×3): 14 mg via TRANSDERMAL
  Filled 2017-08-09 (×4): qty 1

## 2017-08-09 MED ORDER — ORAL CARE MOUTH RINSE
15.0000 mL | Freq: Two times a day (BID) | OROMUCOSAL | Status: DC
  Administered 2017-08-09 – 2017-08-10 (×3): 15 mL via OROMUCOSAL

## 2017-08-09 MED ORDER — ENSURE ENLIVE PO LIQD
237.0000 mL | Freq: Two times a day (BID) | ORAL | Status: DC
  Administered 2017-08-09 – 2017-08-11 (×4): 237 mL via ORAL

## 2017-08-09 NOTE — Progress Notes (Signed)
Nutrition Brief Note  Patient identified on the Malnutrition Screening Tool (MST) Report  Wt Readings from Last 15 Encounters:  08/09/17 167 lb 8.8 oz (76 kg)  06/23/17 162 lb 14.7 oz (73.9 kg)  07/30/11 160 lb (72.6 kg)   Patients reports some loss of taste recently and has more or less had to "force" herself to eat. She says this began around Monday. Amount wise, she denies any significant changes in intake.   She says she thinks she has lost "maybe 5 lbs" since dealing with this illness. She gives a UBW of 170-174 lbs. Per chart, she has actually gained weight over the past couple months.   At this time, patient says she is "feeling much better" since admitted. She says her appetite is now good and she has not had any issues eating. She says she is anxiously awaiting her daughter who [facility] be [facility].   She is seen with an Ensure at Bedside. She says these are awful and does not want to receive them. Will d/c given her reported good appetite. She also asks RD to get her "regular" ice cream and bananas with all meals.   No nutrition interventions warranted at this time. If nutrition issues arise, please consult RD.   Christophe LouisNathan Franks RD, LDN, CNSC Clinical Nutrition Pager: [phone] 08/09/2017 1:07 PM

## 2017-08-09 NOTE — ED Notes (Signed)
Patient intermittently removes O2 cannula (desats to 88%). O2 bumped up to 6 liters via nasal cannula to maintain sat of 90-92%. Patient encouraged to wear cannula.

## 2017-08-09 NOTE — Progress Notes (Signed)
PROGRESS NOTE    Rita Jackson  HYI:[id number] DOB: 10-02-1955 DOA: 08/08/2017 PCP: Iona Beard, MD  Outpatient Specialists:     Brief Narrative:  Rita Jackson is a 62 y.o. female with medical history significant  for but limited to COPD on home oxygen 2 L/min by nasal cannula with continued tobacco abuse presenting with progressively worsening few weeks history of dry cough and generalized weakness/malaise. She was recently discharged from the [facility] in late January 2019 after treatment for acute hypoxic respiratory failure secondary to come to acquired pneumonia at which time she was started on home oxygen.  She had felt better on discharge but her symptoms started worsening again on resumption of cigarette smoking.  She denies any history of fever or chills.  At the ED patient was noted to be hypoxic with O2 saturation of 85% on 2 L/min by nasal cannula chest x-ray extremes with some previously seen pneumonia, follow-up CT angiogram clearing of small right pleural effusion and partial clearing of masslike opacification in the right upper lobe, with possible stigmata of bronchiolitis.  She was treated with nebulized bronchodilators and admitted for further management.    Assessment & Plan:   Principal Problem:   Acute on chronic respiratory failure with hypoxia (HCC) Active Problems:   Tobacco abuse   Dehydration   Essential hypertension   Mass of upper lobe of right lung  #1 acute hypoxic respiratory failure: -Due to COPD with acute bronchitis/bronchiolitis - Admit to a telemetry bed - Continuous pulse oximetry with nasal cannula oxygen - f/u calcitonin, CRP, ESR - f/u sputum, respiratory virus panel, strep, Legionella studies - Troponin negative -Twelve-lead EKG negative for acute ST-T wave changes - 125 mg of Solu-Medrol IV - Azithromycin IV - DuoNeb breathing treatments 4 times daily  #2 dehydration: Due to poor oral intake associated with acute  bronchitis/COPD Gentle IV hydration  #3 right upper lobe mass: Repeat CT imaging in 2-3 months follow-up  #4 hypoglycemia: Denies history of previous diagnosis of diabetes mellitus  Check A1c Sliding-scale insulin as needed  #5 tobacco abuse: Tobacco cessation counseling done Continue outpatient follow-up recommended on discharge        DVT prophylaxis: Lovenox Code Status: Full code Family Communication:  Disposition Plan: Home   Consultants: Not applicable   Procedures:  Antimicrobials: IV Zithromax  Subjective: No fever or chills.  She is supple better.  Episodes of desaturations overnight noted  Objective: Vitals:   08/09/17 0500 08/09/17 0621 08/09/17 0808 08/09/17 0858  BP: (!) 104/54 (!) 111/96 (!) 114/57   Pulse: (!) 58 60 (!) 57   Resp: _0 Temp:  98.1 F (36.7 C) (!) 97.5 F (36.4 C)   TempSrc:  Oral Oral   SpO2: 95% 95% 95% 94%  Weight:  76 kg (167 lb 8.8 oz)    Height:  _1  (1.676 m)      Intake/Output Summary (Last 24 hours) at 08/09/2017 1018 Last data filed at 08/09/2017 7867 Gross per 24 hour  Intake 1160 ml  Output -  Net 1160 ml   Filed Weights   08/09/17 0621  Weight: 76 kg (167 lb 8.8 oz)    Examination:  General exam: Appears calm and comfortable, on oxygen by nasal cannula Respiratory system: Diminished breath sounds with expiratory wheezes bilaterally. Cardiovascular system: S1 & S2 heard, RRR. No JVD, murmurs, rubs, gallops or clicks. No pedal edema. Gastrointestinal system: Abdomen is nondistended, soft and nontender. No organomegaly or masses  felt. Normal bowel sounds heard. Central nervous system: Alert and oriented. No focal neurological deficits. Extremities: Symmetric 5 x 5 power. Skin: No rashes, lesions or ulcers Psychiatry: Judgement and insight appear normal. Mood & affect appropriate.     Data Reviewed: I have personally reviewed following labs and imaging studies  CBC: Recent Labs  Lab  08/08/17 1307 08/09/17 0250  WBC 3.8* 3.1*  NEUTROABS 1.9  --   HGB 12.8 12.9  HCT 40.4 39.8  MCV 86.3 85.6  PLT 181 045   Basic Metabolic Panel: Recent Labs  Lab 08/08/17 1307 08/09/17 0250  NA 133* 135  K 4.0 4.5  CL 99* 104  CO2 21* 22  GLUCOSE 108* 244*  BUN 22* 17  CREATININE 0.95 0.86  CALCIUM 8.1* 7.9*   GFR: Estimated Creatinine Clearance: 72.5 mL/min (by C-G formula based on SCr of 0.86 mg/dL). Liver Function Tests: Recent Labs  Lab 08/08/17 1307  AST 19  ALT 19  ALKPHOS 55  BILITOT 1.0  PROT 8.0  ALBUMIN 3.3*   No results for input(s): LIPASE, AMYLASE in the last 168 hours. No results for input(s): AMMONIA in the last 168 hours. Coagulation Profile: No results for input(s): INR, PROTIME in the last 168 hours. Cardiac Enzymes: Recent Labs  Lab 08/08/17 2153  TROPONINI <0.03   BNP (last 3 results) No results for input(s): PROBNP in the last 8760 hours. HbA1C: No results for input(s): HGBA1C in the last 72 hours. CBG: No results for input(s): GLUCAP in the last 168 hours. Lipid Profile: No results for input(s): CHOL, HDL, LDLCALC, TRIG, CHOLHDL, LDLDIRECT in the last 72 hours. Thyroid Function Tests: Recent Labs    08/08/17 2153  TSH 1.929   Anemia Panel: No results for input(s): VITAMINB12, FOLATE, FERRITIN, TIBC, IRON, RETICCTPCT in the last 72 hours. Urine analysis:    Component Value Date/Time   COLORURINE AMBER (A) 08/08/2017 1718   APPEARANCEUR CLEAR 08/08/2017 1718   LABSPEC 1.021 08/08/2017 1718   PHURINE 5.0 08/08/2017 1718   GLUCOSEU NEGATIVE 08/08/2017 1718   HGBUR NEGATIVE 08/08/2017 1718   BILIRUBINUR NEGATIVE 08/08/2017 1718   KETONESUR 5 (A) 08/08/2017 1718   PROTEINUR 30 (A) 08/08/2017 1718   NITRITE NEGATIVE 08/08/2017 1718   LEUKOCYTESUR NEGATIVE 08/08/2017 1718   Sepsis Labs: _0 (procalcitonin:4,lacticidven:4)  ) Recent Results (from the past 240 hour(s))  Respiratory Panel by PCR     Status: Abnormal    Collection Time: 08/08/17  9:30 PM  Result Value Ref Range Status   Adenovirus NOT DETECTED NOT DETECTED Final   Coronavirus 229E NOT DETECTED NOT DETECTED Final   Coronavirus HKU1 NOT DETECTED NOT DETECTED Final   Coronavirus NL63 NOT DETECTED NOT DETECTED Final   Coronavirus OC43 NOT DETECTED NOT DETECTED Final   Metapneumovirus DETECTED (A) NOT DETECTED Final   Rhinovirus / Enterovirus NOT DETECTED NOT DETECTED Final   Influenza A NOT DETECTED NOT DETECTED Final   Influenza B NOT DETECTED NOT DETECTED Final   Parainfluenza Virus 1 NOT DETECTED NOT DETECTED Final   Parainfluenza Virus 2 NOT DETECTED NOT DETECTED Final   Parainfluenza Virus 3 NOT DETECTED NOT DETECTED Final   Parainfluenza Virus 4 NOT DETECTED NOT DETECTED Final   Respiratory Syncytial Virus NOT DETECTED NOT DETECTED Final   Bordetella pertussis NOT DETECTED NOT DETECTED Final   Chlamydophila pneumoniae NOT DETECTED NOT DETECTED Final   Mycoplasma pneumoniae NOT DETECTED NOT DETECTED Final         Radiology Studies: Dg Chest 2 View  Result Date: 08/08/2017 CLINICAL DATA:  62 year old female with shortness weakness of breath. Weakness for 1 week. EXAM: CHEST - 2 VIEW COMPARISON:  Chest CTA 06/23/2017 and earlier. FINDINGS: Virtually resolved confluent pulmonary opacity in the right upper lobe, right middle lobe, and left lower lobe demonstrated on 06/23/2017. Stable lung volumes. Continued diffuse bilateral pulmonary interstitial opacity, but this appears chronic. Stable cardiac size and mediastinal contours. Visualized tracheal air column is within normal limits. No pneumothorax. No pleural effusion. No new confluent pulmonary opacity. No acute osseous abnormality identified. Negative visible bowel gas pattern. IMPRESSION: 1. Bilateral pneumonia seen in January appears resolved. 2. Underlying chronic lung disease. No superimposed acute findings are identified. Electronically Signed   By: Genevie Ann M.D.   On: 08/08/2017  13:42   Ct Angio Chest Pe W And/or Wo Contrast  Result Date: 08/08/2017 CLINICAL DATA:  Generalized weakness and lethargy x1 week. Recent diagnosis pneumonia. Current smoker. EXAM: CT ANGIOGRAPHY CHEST WITH CONTRAST TECHNIQUE: Multidetector CT imaging of the chest was performed using the standard protocol during bolus administration of intravenous contrast. Multiplanar CT image reconstructions and MIPs were obtained to evaluate the vascular anatomy. CONTRAST:  70 cc ISOVUE-370 IOPAMIDOL (ISOVUE-370) INJECTION 76% COMPARISON:  CXR 08/08/2017 and chest CT 06/23/2017 FINDINGS: Cardiovascular: Normal branch pattern of the great vessels. Minimal thoracic aortic atherosclerosis without aneurysm or dissection. Mild ectasia of the ascending thoracic aorta to 3.6 cm. Good opacification of the pulmonary arteries without acute pulmonary embolus. Stable cardiomegaly with small pericardial effusion. Mediastinum/Nodes: Stable right hilar lymphadenopathy likely reactive given right-sided pneumonic consolidations. The largest are currently 13 mm short axis. Patent trachea and mainstem bronchi. Unremarkable CT appearance of the esophagus. Lungs/Pleura: Partial resolution of right upper and middle lobe pneumonia in the setting of centrilobular emphysema. Less masslike appearance and consolidation in the right upper lobe since prior. Near complete clearing of right-sided small pleural effusion and left basilar atelectasis. Minimal subpleural atelectasis is seen at the right lung base. Faint superior segment right upper lobe airspace opacities with tree-in-bud configuration are new since prior associated with peribronchial thickening. Mild peribronchial thickening is also seen on the left. Upper Abdomen: No acute abnormality. Musculoskeletal: No chest wall abnormality. No acute or significant osseous findings. Review of the MIP images confirms the above findings. IMPRESSION: 1. Clearing of small right effusion and partial clearing  of masslike opacity in the right upper lobe though still present. Findings likely represent stigmata of pneumonia with reactive right hilar lymphadenopathy redemonstrated. Follow-up to complete resolution however is recommended to exclude neoplasm. Repeat imaging in 2-3 months is suggested unless clinical situation warrants an earlier return. 2. Bilateral peribronchial thickening with tiny tree-in-bud opacities, new in the right lower lobe superior segment that likely reflect stigmata of bronchiolitis. 3. Stable cardiomegaly.  No acute pulmonary embolus. Aortic Atherosclerosis (ICD10-I70.0) and Emphysema (ICD10-J43.9). Electronically Signed   By: Ashley Royalty M.D.   On: 08/08/2017 18:24        Scheduled Meds: . amLODipine  5 mg Oral Daily  . enoxaparin (LOVENOX) injection  40 mg Subcutaneous Q24H  . feeding supplement (ENSURE ENLIVE)  237 mL Oral BID BM  . ipratropium-albuterol  3 mL Nebulization QID  . mouth rinse  15 mL Mouth Rinse BID  . nicotine  14 mg Transdermal Daily   Continuous Infusions: . sodium chloride 75 mL/hr at 08/08/17 2125  . azithromycin Stopped (08/08/17 2256)  . calcium gluconate       LOS: 0 days    Time spent: 40  minutes    OSEI-BONSU,Forrest Demuro, MD [facility] Pager [phone]  If 7PM-7AM, please contact night-coverage [email or web address] Password [email or web address] 08/09/2017, 10:18 AM

## 2017-08-10 LAB — CBC
HCT: 37.8 % (ref 36.0–46.0)
Hemoglobin: 11.7 g/dL — ABNORMAL LOW (ref 12.0–15.0)
MCH: 26.4 pg (ref 26.0–34.0)
MCHC: 31 g/dL (ref 30.0–36.0)
MCV: 85.3 fL (ref 78.0–100.0)
PLATELETS: 178 10*3/uL (ref 150–400)
RBC: 4.43 MIL/uL (ref 3.87–5.11)
RDW: 14.3 % (ref 11.5–15.5)
WBC: 5 10*3/uL (ref 4.0–10.5)

## 2017-08-10 LAB — GLUCOSE, CAPILLARY
GLUCOSE-CAPILLARY: 210 mg/dL — AB (ref 65–99)
GLUCOSE-CAPILLARY: 286 mg/dL — AB (ref 65–99)
GLUCOSE-CAPILLARY: 297 mg/dL — AB (ref 65–99)
Glucose-Capillary: 188 mg/dL — ABNORMAL HIGH (ref 65–99)

## 2017-08-10 LAB — HEMOGLOBIN A1C
Hgb A1c MFr Bld: 7 % — ABNORMAL HIGH (ref 4.8–5.6)
Mean Plasma Glucose: 154.2 mg/dL

## 2017-08-10 MED ORDER — INSULIN ASPART 100 UNIT/ML ~~LOC~~ SOLN
0.0000 [IU] | Freq: Three times a day (TID) | SUBCUTANEOUS | Status: DC
  Administered 2017-08-10 (×2): 5 [IU] via SUBCUTANEOUS
  Administered 2017-08-11: 3 [IU] via SUBCUTANEOUS

## 2017-08-10 MED ORDER — AZITHROMYCIN 500 MG PO TABS
500.0000 mg | ORAL_TABLET | Freq: Every day | ORAL | Status: DC
  Administered 2017-08-10: 500 mg via ORAL
  Filled 2017-08-10: qty 1

## 2017-08-10 NOTE — Progress Notes (Signed)
PHARMACIST - PHYSICIAN COMMUNICATION  CONCERNING: Antibiotic IV to Oral Route Change Policy  RECOMMENDATION: This patient is receiving azithromycin by the intravenous route.  Based on criteria approved by the Pharmacy and Therapeutics Committee, the antibiotic(s) is/are being converted to the equivalent oral dose form(s).   DESCRIPTION: These criteria include:  Patient being treated for a respiratory tract infection, urinary tract infection, cellulitis or clostridium difficile associated diarrhea if on metronidazole  The patient is not neutropenic and does not exhibit a GI malabsorption state  The patient is eating (either orally or via tube) and/or has been taking other orally administered medications for a least 24 hours  The patient is improving clinically and has a Tmax < 100.5  If you have questions about this conversion, please contact the Pharmacy Department  []   [phone]( [phone] )  [facility] []   [phone]( [phone] )  [facility] [x]   [phone]( [phone] )  [facility] []   [phone]( [phone] )  [facility] []   [phone]( [phone] )  [facility]  Sheppard CoilFrank Wilson PharmD., BCPS Clinical Pharmacist 08/10/2017 7:58 AM

## 2017-08-10 NOTE — Progress Notes (Signed)
PROGRESS NOTE    Rita Jackson  QMV:[id number] DOB: [birth year] DOA: 08/08/2017 PCP: Iona Beard, MD  Outpatient Specialists:     Brief Narrative:  Rita Jackson is a 62 y.o. female with medical history significant  for but limited to COPD on home oxygen 2 L/min by nasal cannula with continued tobacco abuse presenting with progressively worsening few weeks history of dry cough and generalized weakness/malaise. She was recently discharged from the [facility] in late January 2019 after treatment for acute hypoxic respiratory failure secondary to come to acquired pneumonia at which time she was started on home oxygen.  She had felt better on discharge but her symptoms started worsening again on resumption of cigarette smoking.  She denies any history of fever or chills.  At the ED patient was noted to be hypoxic with O2 saturation of 85% on 2 L/min by nasal cannula chest x-ray extremes with some previously seen pneumonia, follow-up CT angiogram clearing of small right pleural effusion and partial clearing of masslike opacification in the right upper lobe, with possible stigmata of bronchiolitis.  She was treated with nebulized bronchodilators and admitted for further management.    Assessment & Plan:   Principal Problem:   Acute on chronic respiratory failure with hypoxia (HCC) Active Problems:   Tobacco abuse   Dehydration   Essential hypertension   Mass of upper lobe of right lung   Acute respiratory failure (HCC)  #1 acute hypoxic respiratory failure: -Due to COPD with acute bronchitis/bronchiolitis - Admit to a telemetry bed - Continuous pulse oximetry with nasal cannula oxygen -  calcitonin <0.10, CRP-P  -  ESR 83 _ Flu A& B neg - f/u sputum, respiratory virus panel, strep, Legionella studies - Troponin negative -Twelve-lead EKG negative for acute ST-T wave changes - IV Steroid - Azithromycin IV - DuoNeb breathing treatments 4 times daily  #2 dehydration: Due  to poor oral intake associated with acute bronchitis/COPD Resolved with gentle IV hydration  #3 right upper lobe mass: Repeat CT imaging in 2-3 months follow-up  #4 hypoglycemia: Denies history of previous diagnosis of diabetes mellitus   A1c - 7.0 % diagnostic of DM2 Sliding-scale insulin  DM Education consult done Consider oral hypoglycemic prior to discharge as needed  #5 tobacco abuse: Tobacco cessation counseling done Continue outpatient follow-up recommended on discharge        DVT prophylaxis: Lovenox Code Status: Full code Family Communication:  Disposition Plan: Home   Consultants: Not applicable   Procedures:  Antimicrobials: IV Zithromax  Subjective: No fever or chills.  She is supple better.  Episodes of desaturations overnight noted  Objective: Vitals:   08/10/17 0000 08/10/17 0249 08/10/17 0400 08/10/17 0823  BP:  113/65  120/60  Pulse: 64 60 (!) 59 78  Resp: (!) '21 19 16 20  ' Temp:  97.8 F (36.6 C)  97.6 F (36.4 C)  TempSrc:  Oral  Oral  SpO2: 93% 96% 97% 96%  Weight:      Height:        Intake/Output Summary (Last 24 hours) at 08/10/2017 1049 Last data filed at 08/10/2017 0900 Gross per 24 hour  Intake 2323.75 ml  Output -  Net 2323.75 ml   Filed Weights   08/09/17 0621  Weight: 76 kg (167 lb 8.8 oz)    Examination:  General exam: Appears calm and comfortable, on oxygen by nasal cannula Respiratory system: Diminished breath sounds with expiratory wheezes bilaterally. Cardiovascular system: S1 & S2 heard, RRR. No JVD,  murmurs, rubs, gallops or clicks. No pedal edema. Gastrointestinal system: Abdomen is nondistended, soft and nontender. No organomegaly or masses felt. Normal bowel sounds heard. Central nervous system: Alert and oriented. No focal neurological deficits. Extremities: Symmetric 5 x 5 power. Skin: No rashes, lesions or ulcers Psychiatry: Judgement and insight appear normal. Mood & affect appropriate.     Data  Reviewed: I have personally reviewed following labs and imaging studies  CBC: Recent Labs  Lab 08/08/17 1307 08/09/17 0250 08/10/17 0243  WBC 3.8* 3.1* 5.0  NEUTROABS 1.9  --   --   HGB 12.8 12.9 11.7*  HCT 40.4 39.8 37.8  MCV 86.3 85.6 85.3  PLT 181 167 659   Basic Metabolic Panel: Recent Labs  Lab 08/08/17 1307 08/09/17 0250  NA 133* 135  K 4.0 4.5  CL 99* 104  CO2 21* 22  GLUCOSE 108* 244*  BUN 22* 17  CREATININE 0.95 0.86  CALCIUM 8.1* 7.9*   GFR: Estimated Creatinine Clearance: 72.5 mL/min (by C-G formula based on SCr of 0.86 mg/dL). Liver Function Tests: Recent Labs  Lab 08/08/17 1307  AST 19  ALT 19  ALKPHOS 55  BILITOT 1.0  PROT 8.0  ALBUMIN 3.3*   No results for input(s): LIPASE, AMYLASE in the last 168 hours. No results for input(s): AMMONIA in the last 168 hours. Coagulation Profile: No results for input(s): INR, PROTIME in the last 168 hours. Cardiac Enzymes: Recent Labs  Lab 08/08/17 2153  TROPONINI <0.03   BNP (last 3 results) No results for input(s): PROBNP in the last 8760 hours. HbA1C: Recent Labs    08/10/17 0243  HGBA1C 7.0*   CBG: Recent Labs  Lab 08/09/17 1208 08/09/17 1729 08/09/17 2140 08/10/17 0820  GLUCAP 253* 232* 243* 188*   Lipid Profile: No results for input(s): CHOL, HDL, LDLCALC, TRIG, CHOLHDL, LDLDIRECT in the last 72 hours. Thyroid Function Tests: Recent Labs    08/08/17 2153  TSH 1.929   Anemia Panel: No results for input(s): VITAMINB12, FOLATE, FERRITIN, TIBC, IRON, RETICCTPCT in the last 72 hours. Urine analysis:    Component Value Date/Time   COLORURINE AMBER (A) 08/08/2017 1718   APPEARANCEUR CLEAR 08/08/2017 1718   LABSPEC 1.021 08/08/2017 1718   PHURINE 5.0 08/08/2017 1718   GLUCOSEU NEGATIVE 08/08/2017 1718   HGBUR NEGATIVE 08/08/2017 1718   BILIRUBINUR NEGATIVE 08/08/2017 1718   KETONESUR 5 (A) 08/08/2017 1718   PROTEINUR 30 (A) 08/08/2017 1718   NITRITE NEGATIVE 08/08/2017 1718    LEUKOCYTESUR NEGATIVE 08/08/2017 1718   Sepsis Labs: '@[email or web address]' (procalcitonin:4,lacticidven:4)  ) Recent Results (from the past 240 hour(s))  Culture, blood (routine x 2) Call MD if unable to obtain prior to antibiotics being given     Status: None (Preliminary result)   Collection Time: 08/08/17  8:35 PM  Result Value Ref Range Status   Specimen Description BLOOD RIGHT ANTECUBITAL  Final   Special Requests   Final    BOTTLES DRAWN AEROBIC AND ANAEROBIC Blood Culture adequate volume   Culture   Final    NO GROWTH < 24 HOURS Performed at [facility], [place] [address] [place], [place] [address]    Report Status PENDING  Incomplete  Culture, blood (routine x 2) Call MD if unable to obtain prior to antibiotics being given     Status: None (Preliminary result)   Collection Time: 08/08/17  9:06 PM  Result Value Ref Range Status   Specimen Description BLOOD RIGHT ANTECUBITAL  Final   Special Requests  Final    BOTTLES DRAWN AEROBIC AND ANAEROBIC Blood Culture adequate volume   Culture   Final    NO GROWTH < 24 HOURS Performed at [facility], [place] [address], [place], [place] [address]    Report Status PENDING  Incomplete  Respiratory Panel by PCR     Status: Abnormal   Collection Time: 08/08/17  9:30 PM  Result Value Ref Range Status   Adenovirus NOT DETECTED NOT DETECTED Final   Coronavirus 229E NOT DETECTED NOT DETECTED Final   Coronavirus HKU1 NOT DETECTED NOT DETECTED Final   Coronavirus NL63 NOT DETECTED NOT DETECTED Final   Coronavirus OC43 NOT DETECTED NOT DETECTED Final   Metapneumovirus DETECTED (A) NOT DETECTED Final   Rhinovirus / Enterovirus NOT DETECTED NOT DETECTED Final   Influenza A NOT DETECTED NOT DETECTED Final   Influenza B NOT DETECTED NOT DETECTED Final   Parainfluenza Virus 1 NOT DETECTED NOT DETECTED Final   Parainfluenza Virus 2 NOT DETECTED NOT DETECTED Final   Parainfluenza Virus 3 NOT DETECTED NOT DETECTED Final   Parainfluenza Virus 4 NOT  DETECTED NOT DETECTED Final   Respiratory Syncytial Virus NOT DETECTED NOT DETECTED Final   Bordetella pertussis NOT DETECTED NOT DETECTED Final   Chlamydophila pneumoniae NOT DETECTED NOT DETECTED Final   Mycoplasma pneumoniae NOT DETECTED NOT DETECTED Final  MRSA PCR Screening     Status: Abnormal   Collection Time: 08/09/17  6:23 AM  Result Value Ref Range Status   MRSA by PCR POSITIVE (A) NEGATIVE Final    Comment:        The GeneXpert MRSA Assay (FDA approved for NASAL specimens only), is one component of a comprehensive MRSA colonization surveillance program. It is not intended to diagnose MRSA infection nor to guide or monitor treatment for MRSA infections. RESULT CALLED TO, READ BACK BY AND VERIFIED WITH: DFIELDS,RN '@[id number]'  08/09/17 BY LHOWARD Performed at [facility], [place] [address], [place], [place] [address]          Radiology Studies: Dg Chest 2 View  Result Date: 08/08/2017 CLINICAL DATA:  62 year old female with shortness weakness of breath. Weakness for 1 week. EXAM: CHEST - 2 VIEW COMPARISON:  Chest CTA 06/23/2017 and earlier. FINDINGS: Virtually resolved confluent pulmonary opacity in the right upper lobe, right middle lobe, and left lower lobe demonstrated on 06/23/2017. Stable lung volumes. Continued diffuse bilateral pulmonary interstitial opacity, but this appears chronic. Stable cardiac size and mediastinal contours. Visualized tracheal air column is within normal limits. No pneumothorax. No pleural effusion. No new confluent pulmonary opacity. No acute osseous abnormality identified. Negative visible bowel gas pattern. IMPRESSION: 1. Bilateral pneumonia seen in January appears resolved. 2. Underlying chronic lung disease. No superimposed acute findings are identified. Electronically Signed   By: Genevie Ann M.D.   On: 08/08/2017 13:42   Ct Angio Chest Pe W And/or Wo Contrast  Result Date: 08/08/2017 CLINICAL DATA:  Generalized weakness and lethargy x1 week.  Recent diagnosis pneumonia. Current smoker. EXAM: CT ANGIOGRAPHY CHEST WITH CONTRAST TECHNIQUE: Multidetector CT imaging of the chest was performed using the standard protocol during bolus administration of intravenous contrast. Multiplanar CT image reconstructions and MIPs were obtained to evaluate the vascular anatomy. CONTRAST:  70 cc ISOVUE-370 IOPAMIDOL (ISOVUE-370) INJECTION 76% COMPARISON:  CXR 08/08/2017 and chest CT 06/23/2017 FINDINGS: Cardiovascular: Normal branch pattern of the great vessels. Minimal thoracic aortic atherosclerosis without aneurysm or dissection. Mild ectasia of the ascending thoracic aorta to 3.6 cm. Good opacification of  the pulmonary arteries without acute pulmonary embolus. Stable cardiomegaly with small pericardial effusion. Mediastinum/Nodes: Stable right hilar lymphadenopathy likely reactive given right-sided pneumonic consolidations. The largest are currently 13 mm short axis. Patent trachea and mainstem bronchi. Unremarkable CT appearance of the esophagus. Lungs/Pleura: Partial resolution of right upper and middle lobe pneumonia in the setting of centrilobular emphysema. Less masslike appearance and consolidation in the right upper lobe since prior. Near complete clearing of right-sided small pleural effusion and left basilar atelectasis. Minimal subpleural atelectasis is seen at the right lung base. Faint superior segment right upper lobe airspace opacities with tree-in-bud configuration are new since prior associated with peribronchial thickening. Mild peribronchial thickening is also seen on the left. Upper Abdomen: No acute abnormality. Musculoskeletal: No chest wall abnormality. No acute or significant osseous findings. Review of the MIP images confirms the above findings. IMPRESSION: 1. Clearing of small right effusion and partial clearing of masslike opacity in the right upper lobe though still present. Findings likely represent stigmata of pneumonia with reactive right  hilar lymphadenopathy redemonstrated. Follow-up to complete resolution however is recommended to exclude neoplasm. Repeat imaging in 2-3 months is suggested unless clinical situation warrants an earlier return. 2. Bilateral peribronchial thickening with tiny tree-in-bud opacities, new in the right lower lobe superior segment that likely reflect stigmata of bronchiolitis. 3. Stable cardiomegaly.  No acute pulmonary embolus. Aortic Atherosclerosis (ICD10-I70.0) and Emphysema (ICD10-J43.9). Electronically Signed   By: Ashley Royalty M.D.   On: 08/08/2017 18:24        Scheduled Meds: . amLODipine  5 mg Oral Daily  . azithromycin  500 mg Oral QHS  . Chlorhexidine Gluconate Cloth  6 each Topical Q0600  . enoxaparin (LOVENOX) injection  40 mg Subcutaneous Q24H  . feeding supplement (ENSURE ENLIVE)  237 mL Oral BID BM  . mouth rinse  15 mL Mouth Rinse BID  . methylPREDNISolone (SOLU-MEDROL) injection  80 mg Intravenous Q6H  . mupirocin ointment  1 application Nasal BID  . nicotine  14 mg Transdermal Daily   Continuous Infusions: . sodium chloride 75 mL/hr at 08/10/17 0931     LOS: 1 day    Time spent: 25 minutes    OSEI-BONSU,Masiyah Engen, MD [facility] Pager [phone]  If 7PM-7AM, please contact night-coverage [email or web address] Password [facility] 08/10/2017, 10:49 AM

## 2017-08-11 DIAGNOSIS — J9621 Acute and chronic respiratory failure with hypoxia: Secondary | ICD-10-CM

## 2017-08-11 LAB — GLUCOSE, CAPILLARY
GLUCOSE-CAPILLARY: 211 mg/dL — AB (ref 65–99)
Glucose-Capillary: 201 mg/dL — ABNORMAL HIGH (ref 65–99)

## 2017-08-11 LAB — LEGIONELLA PNEUMOPHILA SEROGP 1 UR AG: L. PNEUMOPHILA SEROGP 1 UR AG: NEGATIVE

## 2017-08-11 MED ORDER — LIVING WELL WITH DIABETES BOOK
Freq: Once | Status: DC
  Filled 2017-08-11: qty 1

## 2017-08-11 MED ORDER — BLOOD GLUCOSE METER KIT: PACK | 0 refills | Status: DC

## 2017-08-11 MED ORDER — PREDNISONE 10 MG PO TABS: 50.0000 mg | ORAL_TABLET | Freq: Every day | ORAL | 0 refills | Status: AC

## 2017-08-11 MED ORDER — PREDNISONE 10 MG PO TABS: 50.0000 mg | ORAL_TABLET | Freq: Every day | ORAL | 0 refills | Status: DC

## 2017-08-11 MED ORDER — METFORMIN HCL 500 MG PO TABS: 500.0000 mg | ORAL_TABLET | Freq: Two times a day (BID) | ORAL | 0 refills | Status: AC

## 2017-08-11 MED ORDER — AZITHROMYCIN 500 MG PO TABS: 500.0000 mg | ORAL_TABLET | Freq: Every day | ORAL | 0 refills | Status: AC

## 2017-08-11 NOTE — Progress Notes (Signed)
Pt ambulated in hall on Room Air per MD request prior to d.c pt sitting in bed with 2L o2 on prior to ambulating 94%- while ambulating in hallways on room air o2 sats at its lowest was 86%, pt reports that she does not feel short of breath, that she wears 2L at home PRN and knows when she needs it. Pt returned to bed and 2L placed back on pt prior to RN leaving room, o2s sats returned to 92%.

## 2017-08-11 NOTE — Care Management Note (Signed)
Case Management Note  Patient Details  Name: Andrew AuJuanita D Aven MRN: [id number] Date of Birth: [birth year]  Subjective/Objective:   Pt admitted with Resp failure secondary to PNA  Action/Plan:   PTA independent from home with family.  Pt was already on continuous 2 liters PTA - pt states she has portable tank for transport home.  Pt has PCP and denied barriers to obtaining/paying for medications.   Expected Discharge Date:  08/11/17               Expected Discharge Plan:  Home/Self Care  In-House Referral:     Discharge planning Services  CM Consult  Post Acute Care Choice:    Choice offered to:     DME Arranged:    DME Agency:     HH Arranged:    HH Agency:     Status of Service:  Completed, signed off  If discussed at [employer] Length of Stay Meetings, dates discussed:    Additional Comments:  Cherylann ParrClaxton, Ozzie Remmers S, RN 08/11/2017, 1:46 PM

## 2017-08-11 NOTE — Progress Notes (Addendum)
Inpatient Diabetes Program Recommendations  AACE/ADA: New Consensus Statement on Inpatient Glycemic Control (2015)  Target Ranges:  Prepandial:   less than 140 mg/dL      Peak postprandial:   less than 180 mg/dL (1-2 hours)      Critically ill patients:  140 - 180 mg/dL   Lab Results  Component Value Date   GLUCAP 201 (H) 08/11/2017   HGBA1C 7.0 (H) 08/10/2017    Review of Glycemic Control Results for Rita Jackson, Rita Jackson (MRN [id number]) as of 08/11/2017 09:11  Ref. Range 08/10/2017 08:20 08/10/2017 12:24 08/10/2017 18:01 08/10/2017 21:30 08/11/2017 08:09  Glucose-Capillary Latest Ref Range: 65 - 99 mg/dL 188 (H) 210 (H) 286 (H) 297 (H) 201 (H)   Diabetes history: No prior DM Current orders for Inpatient glycemic control: Novolog sensitive correction tid  Inpatient Diabetes Program Recommendations:   Noted patient currently on Solumedrol. Ordered Living Well With Diabetes book, dietician consult, and patient education videos. Will plan to speak to patient today.  Addendum: Met with patient @ bedside. Spoke with pt about new diagnosis. Discussed A1C results with them and explained what an A1C is, basic pathophysiology of DM Type 2, basic home care, basic diabetes diet nutrition principles, importance of checking CBGs and maintaining good CBG control to prevent long-term and short-term complications. Reviewed signs and symptoms of hyperglycemia and hypoglycemia and how to treat hypoglycemia at home. Also reviewed blood sugar goals at home.  RNs to provide ongoing basic DM education at bedside with this patient. Have ordered educational booklet, and DM videos. Have also placed RD consult for DM diet education for this patient.  Patient acknowledged understanding and asked if she needed to continue watching sugar intake if off of steroids and explained to patient yes. Patient shared her mom had diabetes and was knowledgeable about diet during that time and plans to attend outpatient diabetes  education with sister @ [place].  Thank you, Nani Gasser. Rita Masullo, RN, MSN, CDE  Diabetes Coordinator Inpatient Glycemic Control Team Team Pager [phone] (8am-5pm) 08/11/2017 9:18 AM

## 2017-08-11 NOTE — Plan of Care (Signed)
  RD consulted for nutrition education regarding diabetes.   Lab Results  Component Value Date   HGBA1C 7.0 (H) 08/10/2017   Case discussed with RN, who reports plan for pt to discharge later today.  Spoke with pt and family member at bedside; they are very eager to discharge today. Pt recalls visit with DM coordinator earlier this morning. She expresses intent to "eat better"; pt verbalized need to choose lean meats, eat more vegetables, and consume more low-calorie beverages. Offered to review handout and go into more detail with pt, but she politely declined, stating that she would prefer to read handouts at her leisure.   Due to new diagnosis, will order outpatient self-management education for pt.   RD provided "Carbohydrate Counting for People with Diabetes" and "Carbohydrates: Label Reading Tips" handouts from the Academy of Nutrition and Dietetics. Discussed different food groups and their effects on blood sugar, emphasizing carbohydrate-containing foods. Provided list of carbohydrates and recommended serving sizes of common foods.  Discussed importance of controlled and consistent carbohydrate intake throughout the day. Provided examples of ways to balance meals/snacks and encouraged intake of high-fiber, whole grain complex carbohydrates. Teach back method used.  Expect fair compliance.  Body mass index is 27.04 kg/m. Pt meets criteria for overweight based on current BMI.  Current diet order is Carb Modified, patient is consuming approximately 25-80% of meals at this time. Labs and medications reviewed. No further nutrition interventions warranted at this time. RD contact information provided. If additional nutrition issues arise, please re-consult RD.  Edson Deridder A. Mayford KnifeWilliams, RD, LDN, CDE Pager: [phone] After hours Pager: [phone]

## 2017-08-11 NOTE — Progress Notes (Signed)
Pt discharging to home, all discharge instructions reviewed with patient who had no questions at this time.

## 2017-08-11 NOTE — Discharge Summary (Signed)
Physician Discharge Summary  Rita Jackson ASN:[id number] DOB: 04-Jan-1956 DOA: 08/08/2017  PCP: Iona Beard, MD  Admit date: 08/08/2017 Discharge date: 08/11/2017  Admitted From: [facility] Disposition:  Home  Recommendations for Outpatient Follow-up:  1. Follow up with PCP in 1-2 weeks 2. Take Prednisone Orally and azithromycin as prescribed  3. Metformin 500 mg twice daily has been prescribed for new onset diabetes.  Diabetic education provided.  She has been taught how to use fingerstick to check her blood sugars routinely. 4. Use nebulizer treatments and rescue inhaler at home as necessary.  Continue 2 L nasal cannula supplemental oxygen 5. Recommend outpatient CT of the chest again in the next 3 months to ensure resolution of right upper lobe masslike nodule.   Discharge Condition: Stable CODE STATUS: Full code Diet recommendation: Diabetic diet  Brief/Interim Summary: 62 year old female with history of COPD on 2 L nasal cannula, tobacco use, essential hypertension, lung nodule came to the hospital with progressive worsening of dry cough and weakness.  Patient continues to smoke and was noted to be hypoxic at 85% on 2 L nasal cannula in the ER.  CTA showed small right pleural effusion, partial clearing of masslike opacification in right upper lobe with possible stigmata of bronchiolitis.  She was admitted for nebulizer treatment, steroids and antibiotics. Upon admission she was given IV Solu-Medrol and azithromycin.  She also received periodic nebulizer treatments.  Over the course of 2-3 days her symptoms improved and she was able to ambulate in the hallway on 2 L nasal cannula saturating 95%.  She also had her A1c checked and was found to have a hemoglobin A1c of 7.0 with new onset diabetes.  She was started on metformin 500 mg twice daily and was provided diabetic and diet education.  She is also advised to quit using tobacco and was offered nicotine patch if needed.  Today she has  reached maximum benefit from an hospital stay and is stable to be discharged with outpatient follow-up recommendations as stated above. Patient doesn't have any complaints and wishes to go home.    HEENT/EYES = negative for pain, redness, loss of vision, double vision, blurred vision, loss of hearing, sore throat, hoarseness, dysphagia Cardiovascular= negative for chest pain, palpitation, murmurs, lower extremity swelling Respiratory/lungs= negative for shortness of breath, cough, hemoptysis, wheezing, mucus production Gastrointestinal= negative for nausea, vomiting,, abdominal pain, melena, hematemesis Genitourinary= negative for Dysuria, Hematuria, Change in Urinary Frequency MSK = Negative for arthralgia, myalgias, Back Pain, Joint swelling  Neurology= Negative for headache, seizures, numbness, tingling  Psychiatry= Negative for anxiety, depression, suicidal and homocidal ideation Allergy/Immunology= Medication/Food allergy as listed  Skin= Negative for Rash, lesions, ulcers, itching  Discharge Diagnoses:  Principal Problem:   Acute on chronic respiratory failure with hypoxia (HCC) Active Problems:   Tobacco abuse   Dehydration   Essential hypertension   Mass of upper lobe of right lung   Acute respiratory failure (HCC)  Acute on chronic hypoxic respiratory failure Mild acute exacerbation of mild intermittent COPD -Influenza swab has been negative.  Respiratory panel was positive for viral URI - We will transition patient's IV steroid to oral prednisone and also give 3 more days of azithromycin -She can continue using nebulizer as needed as home and also keep rescue inhaler on her -Currently back to her 2 L nasal cannula for oxygen supplementation at rest and with ambulation  New onset diabetes mellitus type 2 - Education for diabetes and diet has been provided by the coordinator and the  dietitian -We will start patient on metformin 500 mg twice daily.  Appropriate prescriptions  have been given  Tobacco use -Counseled to quit using tobacco.  Nicotine patch offered.  Right hilar lymphadenopathy with masslike opacity in right upper lobe - Recommend outpatient repeat imaging in about 2-3 months.  Discharge Instructions   Allergies as of 08/11/2017   No Known Allergies     Medication List    TAKE these medications   amLODipine 5 MG tablet Commonly known as:  NORVASC Take 5 mg by mouth daily.   azithromycin 500 MG tablet Commonly known as:  ZITHROMAX Take 1 tablet (500 mg total) by mouth daily for 3 days.   blood glucose meter kit and supplies Dispense based on patient and insurance preference. Use up to four times daily as directed. (FOR ICD-10 E10.9, E11.9).   guaiFENesin-dextromethorphan 100-10 MG/5ML syrup Commonly known as:  ROBITUSSIN DM Take 5 mLs by mouth every 4 (four) hours as needed for cough.   ipratropium-albuterol 0.5-2.5 (3) MG/3ML Soln Commonly known as:  DUONEB Take 3 mLs by nebulization every 4 (four) hours as needed (wheezing and shortness of breath, coughing spells).   metFORMIN 500 MG tablet Commonly known as:  GLUCOPHAGE Take 1 tablet (500 mg total) by mouth 2 (two) times daily with a meal.   oseltamivir 75 MG capsule Commonly known as:  TAMIFLU Take 75 mg by mouth 2 (two) times daily.   OXYGEN Inhale 2 L into the lungs continuous.   predniSONE 10 MG tablet Commonly known as:  DELTASONE Take 5 tablets (50 mg total) by mouth daily with breakfast.   VIRTUSSIN A/C 100-10 MG/5ML syrup Generic drug:  guaiFENesin-codeine Take 10 mLs by mouth every 6 (six) hours as needed.      Follow-up Information    Iona Beard, MD. Schedule an appointment as soon as possible for a visit in 1 week(s).   Specialty:  Family Medicine Contact information: [address] [place] [address] [phone]          No Known Allergies  Consultations:  Diabetic coordinator  Dietitian   Procedures/Studies: Dg Chest 2  View  Result Date: 08/08/2017 CLINICAL DATA:  62 year old female with shortness weakness of breath. Weakness for 1 week. EXAM: CHEST - 2 VIEW COMPARISON:  Chest CTA 06/23/2017 and earlier. FINDINGS: Virtually resolved confluent pulmonary opacity in the right upper lobe, right middle lobe, and left lower lobe demonstrated on 06/23/2017. Stable lung volumes. Continued diffuse bilateral pulmonary interstitial opacity, but this appears chronic. Stable cardiac size and mediastinal contours. Visualized tracheal air column is within normal limits. No pneumothorax. No pleural effusion. No new confluent pulmonary opacity. No acute osseous abnormality identified. Negative visible bowel gas pattern. IMPRESSION: 1. Bilateral pneumonia seen in January appears resolved. 2. Underlying chronic lung disease. No superimposed acute findings are identified. Electronically Signed   By: Genevie Ann M.D.   On: 08/08/2017 13:42   Ct Angio Chest Pe W And/or Wo Contrast  Result Date: 08/08/2017 CLINICAL DATA:  Generalized weakness and lethargy x1 week. Recent diagnosis pneumonia. Current smoker. EXAM: CT ANGIOGRAPHY CHEST WITH CONTRAST TECHNIQUE: Multidetector CT imaging of the chest was performed using the standard protocol during bolus administration of intravenous contrast. Multiplanar CT image reconstructions and MIPs were obtained to evaluate the vascular anatomy. CONTRAST:  70 cc ISOVUE-370 IOPAMIDOL (ISOVUE-370) INJECTION 76% COMPARISON:  CXR 08/08/2017 and chest CT 06/23/2017 FINDINGS: Cardiovascular: Normal branch pattern of the great vessels. Minimal thoracic aortic atherosclerosis without aneurysm or  dissection. Mild ectasia of the ascending thoracic aorta to 3.6 cm. Good opacification of the pulmonary arteries without acute pulmonary embolus. Stable cardiomegaly with small pericardial effusion. Mediastinum/Nodes: Stable right hilar lymphadenopathy likely reactive given right-sided pneumonic consolidations. The largest are  currently 13 mm short axis. Patent trachea and mainstem bronchi. Unremarkable CT appearance of the esophagus. Lungs/Pleura: Partial resolution of right upper and middle lobe pneumonia in the setting of centrilobular emphysema. Less masslike appearance and consolidation in the right upper lobe since prior. Near complete clearing of right-sided small pleural effusion and left basilar atelectasis. Minimal subpleural atelectasis is seen at the right lung base. Faint superior segment right upper lobe airspace opacities with tree-in-bud configuration are new since prior associated with peribronchial thickening. Mild peribronchial thickening is also seen on the left. Upper Abdomen: No acute abnormality. Musculoskeletal: No chest wall abnormality. No acute or significant osseous findings. Review of the MIP images confirms the above findings. IMPRESSION: 1. Clearing of small right effusion and partial clearing of masslike opacity in the right upper lobe though still present. Findings likely represent stigmata of pneumonia with reactive right hilar lymphadenopathy redemonstrated. Follow-up to complete resolution however is recommended to exclude neoplasm. Repeat imaging in 2-3 months is suggested unless clinical situation warrants an earlier return. 2. Bilateral peribronchial thickening with tiny tree-in-bud opacities, new in the right lower lobe superior segment that likely reflect stigmata of bronchiolitis. 3. Stable cardiomegaly.  No acute pulmonary embolus. Aortic Atherosclerosis (ICD10-I70.0) and Emphysema (ICD10-J43.9). Electronically Signed   By: Ashley Royalty M.D.   On: 08/08/2017 18:24  Patient does not have any complaints and she wishes to go home.   Subjective:   Discharge Exam: Vitals:   08/11/17 0400 08/11/17 0811  BP:  (!) 119/59  Pulse: (!) 47 75  Resp: 16 18  Temp:  97.9 F (36.6 C)  SpO2: 96% 95%   Vitals:   08/11/17 0000 08/11/17 0352 08/11/17 0400 08/11/17 0811  BP:  (!) 115/53  (!) 119/59   Pulse: 62 (!) 44 (!) 47 75  Resp: _0 Temp:  97.6 F (36.4 C)  97.9 F (36.6 C)  TempSrc:  Oral  Oral  SpO2: 94% 98% 96% 95%  Weight:      Height:        General: Pt is alert, awake, not in acute distress Cardiovascular: RRR, S1/S2 +, no rubs, no gallops Respiratory: CTA bilaterally, no wheezing, no rhonchi Abdominal: Soft, NT, ND, bowel sounds + Extremities: no edema, no cyanosis    The results of significant diagnostics from this hospitalization (including imaging, microbiology, ancillary and laboratory) are listed below for reference.     Microbiology: Recent Results (from the past 240 hour(s))  Culture, blood (routine x 2) Call MD if unable to obtain prior to antibiotics being given     Status: None (Preliminary result)   Collection Time: 08/08/17  8:35 PM  Result Value Ref Range Status   Specimen Description BLOOD RIGHT ANTECUBITAL  Final   Special Requests   Final    BOTTLES DRAWN AEROBIC AND ANAEROBIC Blood Culture adequate volume   Culture   Final    NO GROWTH 2 DAYS Performed at [facility], [address], [place], [place] [address]    Report Status PENDING  Incomplete  Culture, blood (routine x 2) Call MD if unable to obtain prior to antibiotics being given     Status: None (Preliminary result)   Collection Time: 08/08/17  9:06 PM  Result Value Ref Range Status   Specimen Description BLOOD RIGHT ANTECUBITAL  Final   Special Requests   Final    BOTTLES DRAWN AEROBIC AND ANAEROBIC Blood Culture adequate volume   Culture   Final    NO GROWTH 2 DAYS Performed at [facility], [address] [place], [place] [address]    Report Status PENDING  Incomplete  Respiratory Panel by PCR     Status: Abnormal   Collection Time: 08/08/17  9:30 PM  Result Value Ref Range Status   Adenovirus NOT DETECTED NOT DETECTED Final   Coronavirus 229E NOT DETECTED NOT DETECTED Final   Coronavirus HKU1 NOT DETECTED NOT DETECTED Final   Coronavirus NL63 NOT  DETECTED NOT DETECTED Final   Coronavirus OC43 NOT DETECTED NOT DETECTED Final   Metapneumovirus DETECTED (A) NOT DETECTED Final   Rhinovirus / Enterovirus NOT DETECTED NOT DETECTED Final   Influenza A NOT DETECTED NOT DETECTED Final   Influenza B NOT DETECTED NOT DETECTED Final   Parainfluenza Virus 1 NOT DETECTED NOT DETECTED Final   Parainfluenza Virus 2 NOT DETECTED NOT DETECTED Final   Parainfluenza Virus 3 NOT DETECTED NOT DETECTED Final   Parainfluenza Virus 4 NOT DETECTED NOT DETECTED Final   Respiratory Syncytial Virus NOT DETECTED NOT DETECTED Final   Bordetella pertussis NOT DETECTED NOT DETECTED Final   Chlamydophila pneumoniae NOT DETECTED NOT DETECTED Final   Mycoplasma pneumoniae NOT DETECTED NOT DETECTED Final  MRSA PCR Screening     Status: Abnormal   Collection Time: 08/09/17  6:23 AM  Result Value Ref Range Status   MRSA by PCR POSITIVE (A) NEGATIVE Final    Comment:        The GeneXpert MRSA Assay (FDA approved for NASAL specimens only), is one component of a comprehensive MRSA colonization surveillance program. It is not intended to diagnose MRSA infection nor to guide or monitor treatment for MRSA infections. RESULT CALLED TO, READ BACK BY AND VERIFIED WITH: DFIELDS,RN _0  08/09/17 BY LHOWARD Performed at [facility], [place] [address], [place], [place] [address]      Labs: BNP (last 3 results) No results for input(s): BNP in the last 8760 hours. Basic Metabolic Panel: Recent Labs  Lab 08/08/17 1307 08/09/17 0250  NA 133* 135  K 4.0 4.5  CL 99* 104  CO2 21* 22  GLUCOSE 108* 244*  BUN 22* 17  CREATININE 0.95 0.86  CALCIUM 8.1* 7.9*   Liver Function Tests: Recent Labs  Lab 08/08/17 1307  AST 19  ALT 19  ALKPHOS 55  BILITOT 1.0  PROT 8.0  ALBUMIN 3.3*   No results for input(s): LIPASE, AMYLASE in the last 168 hours. No results for input(s): AMMONIA in the last 168 hours. CBC: Recent Labs  Lab 08/08/17 1307 08/09/17 0250  08/10/17 0243  WBC 3.8* 3.1* 5.0  NEUTROABS 1.9  --   --   HGB 12.8 12.9 11.7*  HCT 40.4 39.8 37.8  MCV 86.3 85.6 85.3  PLT 181 167 178   Cardiac Enzymes: Recent Labs  Lab 08/08/17 2153  TROPONINI <0.03   BNP: Invalid input(s): POCBNP CBG: Recent Labs  Lab 08/10/17 0820 08/10/17 1224 08/10/17 1801 08/10/17 2130 08/11/17 0809  GLUCAP 188* 210* 286* 297* 201*   D-Dimer No results for input(s): DDIMER in the last 72 hours. Hgb A1c Recent Labs    08/10/17 0243  HGBA1C 7.0*   Lipid Profile No results for input(s): CHOL, HDL, LDLCALC, TRIG, CHOLHDL, LDLDIRECT in  the last 72 hours. Thyroid function studies Recent Labs    08/08/17 2153  TSH 1.929   Anemia work up No results for input(s): VITAMINB12, FOLATE, FERRITIN, TIBC, IRON, RETICCTPCT in the last 72 hours. Urinalysis    Component Value Date/Time   COLORURINE AMBER (A) 08/08/2017 1718   APPEARANCEUR CLEAR 08/08/2017 1718   LABSPEC 1.021 08/08/2017 1718   PHURINE 5.0 08/08/2017 1718   GLUCOSEU NEGATIVE 08/08/2017 1718   HGBUR NEGATIVE 08/08/2017 1718   BILIRUBINUR NEGATIVE 08/08/2017 1718   KETONESUR 5 (A) 08/08/2017 1718   PROTEINUR 30 (A) 08/08/2017 1718   NITRITE NEGATIVE 08/08/2017 1718   LEUKOCYTESUR NEGATIVE 08/08/2017 1718   Sepsis Labs Invalid input(s): PROCALCITONIN,  WBC,  LACTICIDVEN Microbiology Recent Results (from the past 240 hour(s))  Culture, blood (routine x 2) Call MD if unable to obtain prior to antibiotics being given     Status: None (Preliminary result)   Collection Time: 08/08/17  8:35 PM  Result Value Ref Range Status   Specimen Description BLOOD RIGHT ANTECUBITAL  Final   Special Requests   Final    BOTTLES DRAWN AEROBIC AND ANAEROBIC Blood Culture adequate volume   Culture   Final    NO GROWTH 2 DAYS Performed at [facility], [place] [address], [place], [place] [address]    Report Status PENDING  Incomplete  Culture, blood (routine x 2) Call MD if unable to obtain  prior to antibiotics being given     Status: None (Preliminary result)   Collection Time: 08/08/17  9:06 PM  Result Value Ref Range Status   Specimen Description BLOOD RIGHT ANTECUBITAL  Final   Special Requests   Final    BOTTLES DRAWN AEROBIC AND ANAEROBIC Blood Culture adequate volume   Culture   Final    NO GROWTH 2 DAYS Performed at [facility], [place] [address], [place], [place] [address]    Report Status PENDING  Incomplete  Respiratory Panel by PCR     Status: Abnormal   Collection Time: 08/08/17  9:30 PM  Result Value Ref Range Status   Adenovirus NOT DETECTED NOT DETECTED Final   Coronavirus 229E NOT DETECTED NOT DETECTED Final   Coronavirus HKU1 NOT DETECTED NOT DETECTED Final   Coronavirus NL63 NOT DETECTED NOT DETECTED Final   Coronavirus OC43 NOT DETECTED NOT DETECTED Final   Metapneumovirus DETECTED (A) NOT DETECTED Final   Rhinovirus / Enterovirus NOT DETECTED NOT DETECTED Final   Influenza A NOT DETECTED NOT DETECTED Final   Influenza B NOT DETECTED NOT DETECTED Final   Parainfluenza Virus 1 NOT DETECTED NOT DETECTED Final   Parainfluenza Virus 2 NOT DETECTED NOT DETECTED Final   Parainfluenza Virus 3 NOT DETECTED NOT DETECTED Final   Parainfluenza Virus 4 NOT DETECTED NOT DETECTED Final   Respiratory Syncytial Virus NOT DETECTED NOT DETECTED Final   Bordetella pertussis NOT DETECTED NOT DETECTED Final   Chlamydophila pneumoniae NOT DETECTED NOT DETECTED Final   Mycoplasma pneumoniae NOT DETECTED NOT DETECTED Final  MRSA PCR Screening     Status: Abnormal   Collection Time: 08/09/17  6:23 AM  Result Value Ref Range Status   MRSA by PCR POSITIVE (A) NEGATIVE Final    Comment:        The GeneXpert MRSA Assay (FDA approved for NASAL specimens only), is one component of a comprehensive MRSA colonization surveillance program. It is not intended to diagnose MRSA infection nor to guide or monitor treatment for MRSA infections. RESULT CALLED  TO, READ BACK BY  AND VERIFIED WITH: DFIELDS,RN _0  08/09/17 BY LHOWARD Performed at [facility], [place] [address] [place], [place] [address]      Time coordinating discharge: Over 30 minutes  SIGNED:   Damita Lack, MD  [facility] 08/11/2017, 9:55 AM Pager   If 7PM-7AM, please contact night-coverage [email or web address] Password [email or web address]

## 2017-08-13 LAB — CULTURE, BLOOD (ROUTINE X 2)
Culture: NO GROWTH
Culture: NO GROWTH
Special Requests: ADEQUATE
Special Requests: ADEQUATE

## 2017-08-18 DIAGNOSIS — E785 Hyperlipidemia, unspecified: Secondary | ICD-10-CM | POA: Diagnosis not present

## 2017-08-18 DIAGNOSIS — E118 Type 2 diabetes mellitus with unspecified complications: Secondary | ICD-10-CM | POA: Diagnosis not present

## 2017-08-18 DIAGNOSIS — I10 Essential (primary) hypertension: Secondary | ICD-10-CM | POA: Diagnosis not present

## 2017-08-18 DIAGNOSIS — J449 Chronic obstructive pulmonary disease, unspecified: Secondary | ICD-10-CM | POA: Diagnosis not present

## 2017-08-24 DIAGNOSIS — J189 Pneumonia, unspecified organism: Secondary | ICD-10-CM | POA: Diagnosis not present

## 2017-08-26 DIAGNOSIS — J449 Chronic obstructive pulmonary disease, unspecified: Secondary | ICD-10-CM | POA: Diagnosis not present

## 2017-08-26 DIAGNOSIS — J189 Pneumonia, unspecified organism: Secondary | ICD-10-CM | POA: Diagnosis not present

## 2017-08-26 DIAGNOSIS — J9611 Chronic respiratory failure with hypoxia: Secondary | ICD-10-CM | POA: Diagnosis not present

## 2017-08-26 DIAGNOSIS — I10 Essential (primary) hypertension: Secondary | ICD-10-CM | POA: Diagnosis not present

## 2017-09-17 DIAGNOSIS — J449 Chronic obstructive pulmonary disease, unspecified: Secondary | ICD-10-CM | POA: Diagnosis not present

## 2017-09-17 DIAGNOSIS — I10 Essential (primary) hypertension: Secondary | ICD-10-CM | POA: Diagnosis not present

## 2017-09-17 DIAGNOSIS — E118 Type 2 diabetes mellitus with unspecified complications: Secondary | ICD-10-CM | POA: Diagnosis not present

## 2017-09-23 DIAGNOSIS — J189 Pneumonia, unspecified organism: Secondary | ICD-10-CM | POA: Diagnosis not present

## 2017-10-24 DIAGNOSIS — J189 Pneumonia, unspecified organism: Secondary | ICD-10-CM | POA: Diagnosis not present

## 2017-10-27 DIAGNOSIS — B353 Tinea pedis: Secondary | ICD-10-CM | POA: Diagnosis not present

## 2017-10-27 DIAGNOSIS — E119 Type 2 diabetes mellitus without complications: Secondary | ICD-10-CM | POA: Diagnosis not present

## 2017-10-27 DIAGNOSIS — I10 Essential (primary) hypertension: Secondary | ICD-10-CM | POA: Diagnosis not present

## 2017-10-27 DIAGNOSIS — J449 Chronic obstructive pulmonary disease, unspecified: Secondary | ICD-10-CM | POA: Diagnosis not present

## 2017-11-23 DIAGNOSIS — J189 Pneumonia, unspecified organism: Secondary | ICD-10-CM | POA: Diagnosis not present

## 2017-12-22 DIAGNOSIS — E118 Type 2 diabetes mellitus with unspecified complications: Secondary | ICD-10-CM | POA: Diagnosis not present

## 2017-12-22 DIAGNOSIS — I10 Essential (primary) hypertension: Secondary | ICD-10-CM | POA: Diagnosis not present

## 2017-12-22 DIAGNOSIS — J449 Chronic obstructive pulmonary disease, unspecified: Secondary | ICD-10-CM | POA: Diagnosis not present

## 2017-12-24 DIAGNOSIS — J189 Pneumonia, unspecified organism: Secondary | ICD-10-CM | POA: Diagnosis not present

## 2018-01-24 DIAGNOSIS — J189 Pneumonia, unspecified organism: Secondary | ICD-10-CM | POA: Diagnosis not present

## 2018-02-23 DIAGNOSIS — J189 Pneumonia, unspecified organism: Secondary | ICD-10-CM | POA: Diagnosis not present

## 2018-03-26 DIAGNOSIS — J189 Pneumonia, unspecified organism: Secondary | ICD-10-CM | POA: Diagnosis not present

## 2018-04-18 DIAGNOSIS — Z6833 Body mass index (BMI) 33.0-33.9, adult: Secondary | ICD-10-CM | POA: Diagnosis not present

## 2018-04-18 DIAGNOSIS — I10 Essential (primary) hypertension: Secondary | ICD-10-CM | POA: Diagnosis not present

## 2018-04-18 DIAGNOSIS — E785 Hyperlipidemia, unspecified: Secondary | ICD-10-CM | POA: Diagnosis not present

## 2018-04-18 DIAGNOSIS — J069 Acute upper respiratory infection, unspecified: Secondary | ICD-10-CM | POA: Diagnosis not present

## 2018-04-18 DIAGNOSIS — E119 Type 2 diabetes mellitus without complications: Secondary | ICD-10-CM | POA: Diagnosis not present

## 2018-04-18 DIAGNOSIS — E118 Type 2 diabetes mellitus with unspecified complications: Secondary | ICD-10-CM | POA: Diagnosis not present

## 2018-04-25 DIAGNOSIS — J189 Pneumonia, unspecified organism: Secondary | ICD-10-CM | POA: Diagnosis not present

## 2018-05-19 DIAGNOSIS — Z6834 Body mass index (BMI) 34.0-34.9, adult: Secondary | ICD-10-CM | POA: Diagnosis not present

## 2018-05-19 DIAGNOSIS — J449 Chronic obstructive pulmonary disease, unspecified: Secondary | ICD-10-CM | POA: Diagnosis not present

## 2018-05-19 DIAGNOSIS — E119 Type 2 diabetes mellitus without complications: Secondary | ICD-10-CM | POA: Diagnosis not present

## 2018-05-26 DIAGNOSIS — J189 Pneumonia, unspecified organism: Secondary | ICD-10-CM | POA: Diagnosis not present

## 2018-06-26 DIAGNOSIS — J189 Pneumonia, unspecified organism: Secondary | ICD-10-CM | POA: Diagnosis not present

## 2018-06-28 ENCOUNTER — Emergency Department (HOSPITAL_COMMUNITY): Payer: BLUE CROSS/BLUE SHIELD

## 2018-06-28 ENCOUNTER — Inpatient Hospital Stay (HOSPITAL_COMMUNITY)
Admission: EM | Admit: 2018-06-28 | Discharge: 2018-07-01 | DRG: 871 | Disposition: A | Discharge status: Home / Self Care | Payer: BLUE CROSS/BLUE SHIELD | Attending: Internal Medicine | Admitting: Internal Medicine

## 2018-06-28 ENCOUNTER — Encounter (HOSPITAL_COMMUNITY): Payer: Self-pay

## 2018-06-28 ENCOUNTER — Other Ambulatory Visit: Payer: Self-pay

## 2018-06-28 DIAGNOSIS — Z716 Tobacco abuse counseling: Secondary | ICD-10-CM | POA: Diagnosis not present

## 2018-06-28 DIAGNOSIS — Z79899 Other long term (current) drug therapy: Secondary | ICD-10-CM

## 2018-06-28 DIAGNOSIS — Z8701 Personal history of pneumonia (recurrent): Secondary | ICD-10-CM

## 2018-06-28 DIAGNOSIS — Z7952 Long term (current) use of systemic steroids: Secondary | ICD-10-CM

## 2018-06-28 DIAGNOSIS — N179 Acute kidney failure, unspecified: Secondary | ICD-10-CM | POA: Diagnosis not present

## 2018-06-28 DIAGNOSIS — K219 Gastro-esophageal reflux disease without esophagitis: Secondary | ICD-10-CM | POA: Diagnosis present

## 2018-06-28 DIAGNOSIS — Z720 Tobacco use: Secondary | ICD-10-CM

## 2018-06-28 DIAGNOSIS — D72829 Elevated white blood cell count, unspecified: Secondary | ICD-10-CM | POA: Diagnosis present

## 2018-06-28 DIAGNOSIS — J440 Chronic obstructive pulmonary disease with acute lower respiratory infection: Secondary | ICD-10-CM | POA: Diagnosis present

## 2018-06-28 DIAGNOSIS — J9601 Acute respiratory failure with hypoxia: Secondary | ICD-10-CM | POA: Diagnosis present

## 2018-06-28 DIAGNOSIS — J181 Lobar pneumonia, unspecified organism: Secondary | ICD-10-CM

## 2018-06-28 DIAGNOSIS — Z825 Family history of asthma and other chronic lower respiratory diseases: Secondary | ICD-10-CM

## 2018-06-28 DIAGNOSIS — Z7984 Long term (current) use of oral hypoglycemic drugs: Secondary | ICD-10-CM

## 2018-06-28 DIAGNOSIS — R918 Other nonspecific abnormal finding of lung field: Secondary | ICD-10-CM | POA: Diagnosis present

## 2018-06-28 DIAGNOSIS — R000 Tachycardia, unspecified: Secondary | ICD-10-CM | POA: Diagnosis present

## 2018-06-28 DIAGNOSIS — J9621 Acute and chronic respiratory failure with hypoxia: Secondary | ICD-10-CM | POA: Diagnosis not present

## 2018-06-28 DIAGNOSIS — J189 Pneumonia, unspecified organism: Secondary | ICD-10-CM | POA: Diagnosis present

## 2018-06-28 DIAGNOSIS — I10 Essential (primary) hypertension: Secondary | ICD-10-CM | POA: Diagnosis present

## 2018-06-28 DIAGNOSIS — Z9981 Dependence on supplemental oxygen: Secondary | ICD-10-CM | POA: Diagnosis not present

## 2018-06-28 DIAGNOSIS — J449 Chronic obstructive pulmonary disease, unspecified: Secondary | ICD-10-CM | POA: Diagnosis present

## 2018-06-28 DIAGNOSIS — R0602 Shortness of breath: Secondary | ICD-10-CM | POA: Diagnosis not present

## 2018-06-28 DIAGNOSIS — A419 Sepsis, unspecified organism: Secondary | ICD-10-CM | POA: Diagnosis not present

## 2018-06-28 DIAGNOSIS — E119 Type 2 diabetes mellitus without complications: Secondary | ICD-10-CM | POA: Diagnosis not present

## 2018-06-28 DIAGNOSIS — R05 Cough: Secondary | ICD-10-CM | POA: Diagnosis not present

## 2018-06-28 DIAGNOSIS — F17210 Nicotine dependence, cigarettes, uncomplicated: Secondary | ICD-10-CM | POA: Diagnosis present

## 2018-06-28 DIAGNOSIS — F17200 Nicotine dependence, unspecified, uncomplicated: Secondary | ICD-10-CM | POA: Diagnosis not present

## 2018-06-28 DIAGNOSIS — R0902 Hypoxemia: Secondary | ICD-10-CM | POA: Diagnosis present

## 2018-06-28 LAB — COMPREHENSIVE METABOLIC PANEL
ALT: 9 U/L (ref 0–44)
AST: 10 U/L — ABNORMAL LOW (ref 15–41)
Albumin: 3.6 g/dL (ref 3.5–5.0)
Alkaline Phosphatase: 70 U/L (ref 38–126)
Anion gap: 14 (ref 5–15)
BUN: 20 mg/dL (ref 6–20)
CO2: 20 mmol/L — ABNORMAL LOW (ref 22–32)
Calcium: 8.3 mg/dL — ABNORMAL LOW (ref 8.9–10.3)
Chloride: 102 mmol/L (ref 98–111)
Creatinine, Ser: 1.44 mg/dL — ABNORMAL HIGH (ref 0.44–1.00)
GFR calc Af Amer: 46 mL/min — ABNORMAL LOW (ref >60)
GFR calc non Af Amer: 39 mL/min — ABNORMAL LOW (ref >60)
Glucose, Bld: 127 mg/dL — ABNORMAL HIGH (ref 70–99)
POTASSIUM: 3.9 mmol/L (ref 3.5–5.1)
Sodium: 136 mmol/L (ref 135–145)
Total Bilirubin: 1 mg/dL (ref 0.3–1.2)
Total Protein: 8.1 g/dL (ref 6.5–8.1)

## 2018-06-28 LAB — CBC WITH DIFFERENTIAL/PLATELET
Abs Immature Granulocytes: 0.36 10*3/uL — ABNORMAL HIGH (ref 0.00–0.07)
Basophils Absolute: 0.1 10*3/uL (ref 0.0–0.1)
Basophils Relative: 0 %
Eosinophils Absolute: 0 10*3/uL (ref 0.0–0.5)
Eosinophils Relative: 0 %
HCT: 44 % (ref 36.0–46.0)
HEMOGLOBIN: 13.2 g/dL (ref 12.0–15.0)
IMMATURE GRANULOCYTES: 2 %
LYMPHS ABS: 1.5 10*3/uL (ref 0.7–4.0)
Lymphocytes Relative: 6 %
MCH: 26 pg (ref 26.0–34.0)
MCHC: 30 g/dL (ref 30.0–36.0)
MCV: 86.8 fL (ref 80.0–100.0)
Monocytes Absolute: 1.7 10*3/uL — ABNORMAL HIGH (ref 0.1–1.0)
Monocytes Relative: 8 %
Neutro Abs: 19.1 10*3/uL — ABNORMAL HIGH (ref 1.7–7.7)
Neutrophils Relative %: 84 %
Platelets: 240 10*3/uL (ref 150–400)
RBC: 5.07 MIL/uL (ref 3.87–5.11)
RDW: 15.2 % (ref 11.5–15.5)
WBC: 22.7 10*3/uL — ABNORMAL HIGH (ref 4.0–10.5)
nRBC: 0 % (ref 0.0–0.2)

## 2018-06-28 LAB — LACTIC ACID, PLASMA
LACTIC ACID, VENOUS: 1.2 mmol/L (ref 0.5–1.9)
Lactic Acid, Venous: 1 mmol/L (ref 0.5–1.9)

## 2018-06-28 LAB — GLUCOSE, CAPILLARY: GLUCOSE-CAPILLARY: 114 mg/dL — AB (ref 70–99)

## 2018-06-28 LAB — INFLUENZA PANEL BY PCR (TYPE A & B)
INFLAPCR: NEGATIVE
Influenza B By PCR: NEGATIVE

## 2018-06-28 MED ORDER — ACETAMINOPHEN 325 MG PO TABS
650.0000 mg | ORAL_TABLET | Freq: Four times a day (QID) | ORAL | Status: DC | PRN
  Administered 2018-06-28 – 2018-06-29 (×2): 650 mg via ORAL
  Filled 2018-06-28 (×2): qty 2

## 2018-06-28 MED ORDER — INSULIN ASPART 100 UNIT/ML ~~LOC~~ SOLN: 0.0000 [IU] | Freq: Every day | SUBCUTANEOUS | Status: DC

## 2018-06-28 MED ORDER — SODIUM CHLORIDE 0.9 % IV SOLN
1000.0000 mL | INTRAVENOUS | Status: DC
  Administered 2018-06-28: 1000 mL via INTRAVENOUS

## 2018-06-28 MED ORDER — GUAIFENESIN ER 600 MG PO TB12
1200.0000 mg | ORAL_TABLET | Freq: Two times a day (BID) | ORAL | Status: DC
  Administered 2018-06-28 – 2018-07-01 (×6): 1200 mg via ORAL
  Filled 2018-06-28 (×6): qty 2

## 2018-06-28 MED ORDER — IOPAMIDOL (ISOVUE-370) INJECTION 76%
80.0000 mL | Freq: Once | INTRAVENOUS | Status: AC | PRN
  Administered 2018-06-28: 80 mL via INTRAVENOUS

## 2018-06-28 MED ORDER — SODIUM CHLORIDE 0.9 % IV SOLN
500.0000 mg | INTRAVENOUS | Status: DC
  Administered 2018-06-28 – 2018-06-30 (×3): 500 mg via INTRAVENOUS
  Filled 2018-06-28 (×7): qty 500

## 2018-06-28 MED ORDER — LIP MEDEX EX OINT
1.0000 "application " | TOPICAL_OINTMENT | CUTANEOUS | Status: DC | PRN
  Filled 2018-06-28: qty 7

## 2018-06-28 MED ORDER — CEFTRIAXONE SODIUM 2 G IJ SOLR
2.0000 g | INTRAMUSCULAR | Status: DC
Start: 2018-06-28 — End: 2018-07-01
  Administered 2018-06-28 – 2018-06-30 (×3): 2 g via INTRAVENOUS
  Filled 2018-06-28 (×2): qty 20
  Filled 2018-06-28: qty 2
  Filled 2018-06-28 (×4): qty 20

## 2018-06-28 MED ORDER — SODIUM CHLORIDE 0.9 % IV SOLN: INTRAVENOUS | Status: DC

## 2018-06-28 MED ORDER — HYDROCOD POLST-CPM POLST ER 10-8 MG/5ML PO SUER
5.0000 mL | Freq: Two times a day (BID) | ORAL | Status: DC | PRN
  Administered 2018-06-29 – 2018-07-01 (×3): 5 mL via ORAL
  Filled 2018-06-28 (×3): qty 5

## 2018-06-28 MED ORDER — NICOTINE 21 MG/24HR TD PT24
21.0000 mg | MEDICATED_PATCH | Freq: Every day | TRANSDERMAL | Status: DC
  Filled 2018-06-28 (×3): qty 1

## 2018-06-28 MED ORDER — ENOXAPARIN SODIUM 40 MG/0.4ML ~~LOC~~ SOLN
40.0000 mg | SUBCUTANEOUS | Status: DC
  Administered 2018-06-28 – 2018-06-30 (×3): 40 mg via SUBCUTANEOUS
  Filled 2018-06-28 (×3): qty 0.4

## 2018-06-28 MED ORDER — SALINE SPRAY 0.65 % NA SOLN: 1.0000 | NASAL | Status: DC | PRN

## 2018-06-28 MED ORDER — PHENOL 1.4 % MT LIQD: 1.0000 | OROMUCOSAL | Status: DC | PRN

## 2018-06-28 MED ORDER — ACETAMINOPHEN 325 MG PO TABS
650.0000 mg | ORAL_TABLET | Freq: Once | ORAL | Status: AC
  Administered 2018-06-28: 650 mg via ORAL
  Filled 2018-06-28: qty 2

## 2018-06-28 MED ORDER — SODIUM CHLORIDE 0.9 % IV SOLN
1000.0000 mL | INTRAVENOUS | Status: DC
  Administered 2018-06-28 – 2018-06-29 (×2): 1000 mL via INTRAVENOUS

## 2018-06-28 MED ORDER — ACETAMINOPHEN 650 MG RE SUPP: 650.0000 mg | Freq: Once | RECTAL | Status: DC

## 2018-06-28 MED ORDER — INSULIN ASPART 100 UNIT/ML ~~LOC~~ SOLN
0.0000 [IU] | Freq: Three times a day (TID) | SUBCUTANEOUS | Status: DC
  Administered 2018-06-29 – 2018-07-01 (×3): 2 [IU] via SUBCUTANEOUS

## 2018-06-28 MED ORDER — POLYVINYL ALCOHOL 1.4 % OP SOLN: 1.0000 [drp] | OPHTHALMIC | Status: DC | PRN

## 2018-06-28 MED ORDER — IPRATROPIUM-ALBUTEROL 0.5-2.5 (3) MG/3ML IN SOLN
3.0000 mL | Freq: Four times a day (QID) | RESPIRATORY_TRACT | Status: DC
  Administered 2018-06-28 – 2018-06-29 (×5): 3 mL via RESPIRATORY_TRACT
  Filled 2018-06-28 (×5): qty 3

## 2018-06-28 MED ORDER — LORATADINE 10 MG PO TABS: 10.0000 mg | ORAL_TABLET | Freq: Every day | ORAL | Status: DC | PRN

## 2018-06-28 MED ORDER — ALUM & MAG HYDROXIDE-SIMETH 200-200-20 MG/5ML PO SUSP: 30.0000 mL | ORAL | Status: DC | PRN

## 2018-06-28 NOTE — ED Triage Notes (Addendum)
Pt c/o weakness with productive cough that started Friday

## 2018-06-28 NOTE — ED Provider Notes (Signed)
[facility] EMERGENCY DEPARTMENT Provider Note   CSN: [id number] Arrival date & time: 06/28/18  1011     History   Chief Complaint Chief Complaint  Patient presents with  . Fatigue    HPI Rita Jackson is a 63 y.o. female.  HPI  The patient is a 63 year old female, history of tobacco use, history of recurrent respiratory failure and a history of high blood pressure.  She presents to the hospital with a complaint of generalized weakness which started in the last 2 days.  She is noted this first on Friday evening, yesterday became worsened today was severe.  She reports that she does keep oxygen at home to use as needed but has not used it.  She reports some intermittent coughing and is now having some pain at the left base on the back of her chest.  Symptoms are persistent, gradually worsening, they became severe today prompting her visit.  She was driven here by family member.  She does get short of breath when she walks.  Past Medical History:  Diagnosis Date  . Pneumonia   . Tobacco abuse     Patient Active Problem List   Diagnosis Date Noted  . Dehydration 08/09/2017  . Essential hypertension 08/09/2017  . Mass of upper lobe of right lung 08/09/2017  . Acute respiratory failure ([place]) 08/09/2017  . Respiratory failure with hypoxia ([place]) 08/08/2017  . Acute respiratory failure with hypoxia ([place]) 08/08/2017  . CAP (community acquired pneumonia) 06/23/2017  . Acute on chronic respiratory failure with hypoxia ([place]) 06/23/2017  . Tobacco abuse 06/23/2017    History reviewed. No pertinent surgical history.   OB History   No obstetric history on file.      Home Medications    Prior to Admission medications   Medication Sig Start Date End Date Taking? Authorizing Provider  amLODipine (NORVASC) 5 MG tablet Take 5 mg by mouth daily. 07/07/17   [provider]  blood glucose meter kit and supplies Dispense based on patient and insurance preference. Use up to  four times daily as directed. (FOR ICD-10 E10.9, E11.9). 08/11/17   Amin, Ankit Chirag, MD  guaiFENesin-dextromethorphan (ROBITUSSIN DM) 100-10 MG/5ML syrup Take 5 mLs by mouth every 4 (four) hours as needed for cough. Patient not taking: Reported on 08/08/2017 06/26/17   Irwin Brakeman L, MD  ipratropium-albuterol (DUONEB) 0.5-2.5 (3) MG/3ML SOLN Take 3 mLs by nebulization every 4 (four) hours as needed (wheezing and shortness of breath, coughing spells). 06/26/17   Johnson, Clanford L, MD  metFORMIN (GLUCOPHAGE) 500 MG tablet Take 1 tablet (500 mg total) by mouth 2 (two) times daily with a meal. 08/11/17   Amin, Jeanella Flattery, MD  oseltamivir (TAMIFLU) 75 MG capsule Take 75 mg by mouth 2 (two) times daily. 08/05/17   [provider]  OXYGEN Inhale 2 L into the lungs continuous.    [provider]  predniSONE (DELTASONE) 10 MG tablet Take 5 tablets (50 mg total) by mouth daily with breakfast. 08/11/17   Amin, Jeanella Flattery, MD  VIRTUSSIN A/C 100-10 MG/5ML syrup Take 10 mLs by mouth every 6 (six) hours as needed. 08/05/17   [provider]    Family History Family History  Problem Relation Age of Onset  . COPD Sister     Social History Social History   Tobacco Use  . Smoking status: Current Every Day Smoker    Packs/day: 1.00    Types: Cigarettes  . Smokeless tobacco: Never Used  Substance  Use Topics  . Alcohol use: No  . Drug use: No     Allergies   Patient has no known allergies.   Review of Systems Review of Systems  All other systems reviewed and are negative.    Physical Exam Updated Vital Signs BP 121/84 (BP Location: Right Arm)   Pulse (!) 120   Temp 99.8 F (37.7 C) (Oral)   Resp 20   Ht 1.676 m (5' 6")   Wt 81.6 kg   SpO2 (!) 86%   BMI 29.05 kg/m   Physical Exam Vitals signs and nursing note reviewed.  Constitutional:      General: She is in acute distress.     Appearance: She is well-developed. She is ill-appearing.  HENT:      Head: Normocephalic and atraumatic.     Mouth/Throat:     Pharynx: No oropharyngeal exudate.  Eyes:     General: No scleral icterus.       Right eye: No discharge.        Left eye: No discharge.     Conjunctiva/sclera: Conjunctivae normal.     Pupils: Pupils are equal, round, and reactive to light.  Neck:     Musculoskeletal: Normal range of motion and neck supple.     Thyroid: No thyromegaly.     Vascular: No JVD.  Cardiovascular:     Rate and Rhythm: Regular rhythm. Tachycardia present.     Heart sounds: Normal heart sounds. No murmur. No friction rub. No gallop.      Comments: Tachycardic to 120 bpm Pulmonary:     Effort: Respiratory distress present.     Breath sounds: Rales present. No wheezing.     Comments: Increased work of breathing, tachypneic to 30 breaths/min, rales at the left base, speaks in short and sentences Abdominal:     General: Bowel sounds are normal. There is no distension.     Palpations: Abdomen is soft. There is no mass.     Tenderness: There is no abdominal tenderness.  Musculoskeletal: Normal range of motion.        General: No tenderness.  Lymphadenopathy:     Cervical: No cervical adenopathy.  Skin:    General: Skin is warm and dry.     Findings: No erythema or rash.  Neurological:     Mental Status: She is alert.     Coordination: Coordination normal.  Psychiatric:        Behavior: Behavior normal.      ED Treatments / Results  Labs (all labs ordered are listed, but only abnormal results are displayed) Labs Reviewed  CULTURE, BLOOD (ROUTINE X 2)  CULTURE, BLOOD (ROUTINE X 2)  LACTIC ACID, PLASMA  LACTIC ACID, PLASMA  COMPREHENSIVE METABOLIC PANEL  CBC WITH DIFFERENTIAL/PLATELET  URINALYSIS, ROUTINE W REFLEX MICROSCOPIC    EKG None  Radiology No results found.  Procedures .Critical Care Performed by: Noemi Chapel, MD Authorized by: Noemi Chapel, MD   Critical care provider statement:    Critical care time (minutes):   35   Critical care time was exclusive of:  Separately billable procedures and treating other patients and teaching time   Critical care was necessary to treat or prevent imminent or life-threatening deterioration of the following conditions:  Sepsis and respiratory failure   Critical care was time spent personally by me on the following activities:  Blood draw for specimens, development of treatment plan with patient or surrogate, discussions with consultants, evaluation of patient's response to treatment, examination  of patient, obtaining history from patient or surrogate, ordering and performing treatments and interventions, ordering and review of laboratory studies, ordering and review of radiographic studies, pulse oximetry, re-evaluation of patient's condition and review of old charts   (including critical care time)  Medications Ordered in ED Medications  cefTRIAXone (ROCEPHIN) 2 g in sodium chloride 0.9 % 100 mL IVPB (has no administration in time range)  azithromycin (ZITHROMAX) 500 mg in sodium chloride 0.9 % 250 mL IVPB (has no administration in time range)  0.9 %  sodium chloride infusion (has no administration in time range)     Initial Impression / Assessment and Plan / ED Course  I have reviewed the triage vital signs and the nursing notes.  Pertinent labs & imaging results that were available during my care of the patient were reviewed by me and considered in my medical decision making (see chart for details).    The patient's exam and history is consistent with sepsis.  I suspect this is from pneumonia.  She is tachycardic hypoxic to 77% on room air and requiring 4 L by nasal cannula to get her to 90%.  She has no signs of fluid overload or congestive heart failure, she is not wheezing and denies any history of pulmonary emphysematous disease.  We will treat this as a code sepsis, she is not hypotensive, check a lactic acid, labs, chest x-ray, antibiotics have been ordered on  arrival.  Patient will likely need to be admitted to the hospital.  She is critically ill  Of importance the white blood cell count is over 22,000, her lactic acid is 1.0, her metabolic panel shows some renal insufficiency with a creatinine of 1.4 which is new from 0.8 at her last measurement 10 months ago.  IV fluids have been given to help hydrate the kidneys, antibiotics were ordered, the patient came in as a code sepsis and will need to be admitted to the hospital.  Hospitalist paged at 12:25 PM  She is significantly hypoxic without supplemental oxygen.  Vitals:   06/28/18 1027 06/28/18 1029 06/28/18 1115 06/28/18 1130  BP:   120/64 (!) 113/54  Pulse:   (!) 107 (!) 105  Resp:   18 18  Temp:      TempSrc:      SpO2:  (!) 86% (!) 87% 94%  Weight: 81.6 kg     Height: 1.676 m (5' 6")        Final Clinical Impressions(s) / ED Diagnoses   Final diagnoses:  Sepsis without acute organ dysfunction, due to unspecified organism [facility])  Community acquired pneumonia of left lower lobe of lung ([place])      Noemi Chapel, MD 06/30/18 [place]

## 2018-06-28 NOTE — H&P (Addendum)
History and Physical  Rita Jackson FFM:[id number] DOB: 04-21-1956 DOA: 06/28/2018  Referring physician: Sabra Heck MD  PCP: Iona Beard, MD   Chief Complaint: cough, SOB  HPI: Rita Jackson is a 63 y.o. female longtimer smoker with COPD and on continuous oxygen at home (has not been using) presented to ED with complaints of generalized weakness, productive cough with thick green yellow sputum that has persisted for past 2 days associated with malaise.  Pt says that she is getting SOB with ambulation and she reports occasional CP with deep coughing and deep breathing. She is not aware of fever but does report occasional chills.   She has a history of prior pneumonia and reports that this does feel similar.  She says that she is not aware of any known sick contacts.  She says that her primary complaint is the generalized malaise, cough and chest congestion.  She reports a brownish sputum production that is slightly different from her chronic bronchitis.  She denies wheezing.  ED Course: on arrival patient was noted to be hypoxic with pulse ox of 72% and she was placed on supplemental oxygen with rapid recovery.  She was noted on CXR to have findings consistent with pneumonia.  Her WBC was elevated at 22.7.  She had a creatinine of 1.44. She was noted to have a normal lactic acid. She had sinus tachycardia and normal blood pressure.  She will be sent for CTA to rule out PE.    Review of Systems: All systems reviewed and apart from history of presenting illness, are negative.  Past Medical History:  Diagnosis Date  . Pneumonia   . Tobacco abuse    History reviewed. No pertinent surgical history. Social History:  reports that she has been smoking cigarettes. She has been smoking about 1.00 pack per day. She has never used smokeless tobacco. She reports that she does not drink alcohol or use drugs.  No Known Allergies  Family History  Problem Relation Age of Onset  . COPD Sister     Prior  to Admission medications   Medication Sig Start Date End Date Taking? Authorizing Provider  amLODipine (NORVASC) 5 MG tablet Take 5 mg by mouth daily. 07/07/17  Yes [provider]  blood glucose meter kit and supplies Dispense based on patient and insurance preference. Use up to four times daily as directed. (FOR ICD-10 E10.9, E11.9). 08/11/17  Yes Amin, Ankit Chirag, MD  ipratropium-albuterol (DUONEB) 0.5-2.5 (3) MG/3ML SOLN Take 3 mLs by nebulization every 4 (four) hours as needed (wheezing and shortness of breath, coughing spells). 06/26/17  Yes Evans Levee L, MD  metFORMIN (GLUCOPHAGE) 500 MG tablet Take 1 tablet (500 mg total) by mouth 2 (two) times daily with a meal. 08/11/17  Yes Amin, Ankit Chirag, MD  OXYGEN Inhale 2 L into the lungs continuous.   Yes [provider]  azithromycin (ZITHROMAX) 250 MG tablet Take 250 mg by mouth daily.  04/18/18   [provider]  VIRTUSSIN A/C 100-10 MG/5ML syrup Take 10 mLs by mouth every 6 (six) hours as needed. 08/05/17   [provider]   Physical Exam: Vitals:   06/28/18 1029 06/28/18 1115 06/28/18 1130 06/28/18 1231  BP:  120/64 (!) 113/54 (!) 106/52  Pulse:  (!) 107 (!) 105 (!) 102  Resp:  18 18 (!) 25  Temp:      TempSrc:      SpO2: (!) 86% (!) 87% 94% 95%  Weight:  Height:         General exam: Moderately built and nourished patient, lying comfortably supine on the gurney in no obvious distress.  Head, eyes and ENT: Nontraumatic and normocephalic. Pupils equally reacting to light and accommodation. Oral mucosa dry.  Neck: Supple. No JVD, carotid bruit or thyromegaly.  Lymphatics: No lymphadenopathy.  Respiratory system: rales heard LLL No wheezing heard. Mild increased work of breathing. Tachypnea.   Cardiovascular system: S1 and S2 heard, tachycardic.  No JVD, murmurs, gallops, clicks or pedal edema.  Gastrointestinal system: Abdomen is nondistended, soft and nontender. Normal bowel  sounds heard. No organomegaly or masses appreciated.  Central nervous system: Alert and oriented. No focal neurological deficits.  Extremities: Symmetric 5 x 5 power. Peripheral pulses symmetrically felt.   Skin: No rashes or acute findings.  Musculoskeletal system: Negative exam.  Psychiatry: Pleasant and cooperative.  Labs on Admission:  Basic Metabolic Panel: Recent Labs  Lab 06/28/18 1059  NA 136  K 3.9  CL 102  CO2 20*  GLUCOSE 127*  BUN 20  CREATININE 1.44*  CALCIUM 8.3*   Liver Function Tests: Recent Labs  Lab 06/28/18 1059  AST 10*  ALT 9  ALKPHOS 70  BILITOT 1.0  PROT 8.1  ALBUMIN 3.6   No results for input(s): LIPASE, AMYLASE in the last 168 hours. No results for input(s): AMMONIA in the last 168 hours. CBC: Recent Labs  Lab 06/28/18 1059  WBC 22.7*  NEUTROABS 19.1*  HGB 13.2  HCT 44.0  MCV 86.8  PLT 240   Cardiac Enzymes: No results for input(s): CKTOTAL, CKMB, CKMBINDEX, TROPONINI in the last 168 hours.  BNP (last 3 results) No results for input(s): PROBNP in the last 8760 hours. CBG: No results for input(s): GLUCAP in the last 168 hours.  Radiological Exams on Admission: Dg Chest 2 View  Result Date: 06/28/2018 CLINICAL DATA:  Cough. EXAM: CHEST - 2 VIEW COMPARISON:  CT scan and radiographs of August 08, 2017. FINDINGS: Stable cardiomegaly. No pneumothorax is noted. Right lung is unremarkable. Probable mild lingular opacity is noted concerning for pneumonia or atelectasis. No significant pleural effusion is noted. Bony thorax is unremarkable. IMPRESSION: Probable mild lingular opacity is noted concerning for pneumonia or atelectasis. Followup PA and lateral chest X-ray is recommended in 3-4 weeks following trial of antibiotic therapy to ensure resolution and exclude underlying malignancy. Electronically Signed   By: Marijo Conception, M.D.   On: 06/28/2018 11:45   EKG: Independently reviewed. Sinus tachycardia   Assessment/Plan Active  Problems:   CAP (community acquired pneumonia)   Tobacco abuse   Essential hypertension   Mass of upper lobe of right lung   Leukocytosis   Acute hypoxemic respiratory failure (HCC)   Sinus tachycardia   Chain smoker   COPD (chronic obstructive pulmonary disease) ([place])   1. CAP with acute respiratory failure with hypoxemia - Pt has rapidly improved her oxygenation with supplemental oxygen.  She is being sent for CTA  Chest to rule out PE.  Also she is noted to have a history of a right lung mass which will be evaluated with CTA chest.   2. Essential hypertension - holding home meds for now as she has low normal BPs.  3. Sinus tachycardia - secondary to infection, follow blood cultures, treating infection as above, continue IVFs for supportive care.  4. Leukocytosis - markedly elevated WBC presumably secondary to pneumonia - Follow WBC with differential.  5. COPD - Duonebs ordered to be scheduled and  will follow clinically for need for steroids.   6. Tobacco abuse - Pt counseled to avoid using tobacco and to stop cigarettes.  Pt verbalized understanding but does not seem interested in quitting.  Will provide nicotine patches to use while in hospital.  7. Possible atelectasis of mid right lung - incentive spirometry ordered, recheck CXR in AM.    DVT Prophylaxis: lovenox  Code Status: Full   Family Communication: patient   Disposition Plan: Home when medically stabilized    Time spent: 43 minutes   Dejion Grillo Wynetta Emery, MD [facility]  How to contact the [facility] Attending or Consulting provider [place] or covering provider during after hours [place], for this patient?  1. Check the care team in [facility] and look for a) attending/consulting [facility] provider listed and b) the [facility] team listed 2. Log into [email or web address] and use [place]'s universal password to access. If you do not have the password, please contact the hospital operator. 3. Locate the [facility] provider you are looking for under [facility] and page to a number that you can be directly reached. 4. If you still have difficulty reaching the provider, please page the [facility] (Director on Call) for the Hospitalists listed on amion for assistance.

## 2018-06-28 NOTE — ED Triage Notes (Signed)
Placed on [place] 4lpm

## 2018-06-29 ENCOUNTER — Inpatient Hospital Stay (HOSPITAL_COMMUNITY): Payer: BLUE CROSS/BLUE SHIELD

## 2018-06-29 ENCOUNTER — Other Ambulatory Visit: Payer: Self-pay

## 2018-06-29 LAB — COMPREHENSIVE METABOLIC PANEL
ALT: 8 U/L (ref 0–44)
AST: 10 U/L — AB (ref 15–41)
Albumin: 2.8 g/dL — ABNORMAL LOW (ref 3.5–5.0)
Alkaline Phosphatase: 61 U/L (ref 38–126)
Anion gap: 8 (ref 5–15)
BUN: 18 mg/dL (ref 6–20)
CO2: 23 mmol/L (ref 22–32)
CREATININE: 0.86 mg/dL (ref 0.44–1.00)
Calcium: 7.8 mg/dL — ABNORMAL LOW (ref 8.9–10.3)
Chloride: 107 mmol/L (ref 98–111)
GFR calc Af Amer: >60 mL/min (ref >60)
GFR calc non Af Amer: >60 mL/min (ref >60)
Glucose, Bld: 117 mg/dL — ABNORMAL HIGH (ref 70–99)
Potassium: 3.9 mmol/L (ref 3.5–5.1)
Sodium: 138 mmol/L (ref 135–145)
Total Bilirubin: 0.6 mg/dL (ref 0.3–1.2)
Total Protein: 6.8 g/dL (ref 6.5–8.1)

## 2018-06-29 LAB — CBC WITH DIFFERENTIAL/PLATELET
Abs Immature Granulocytes: 0.08 10*3/uL — ABNORMAL HIGH (ref 0.00–0.07)
Basophils Absolute: 0 10*3/uL (ref 0.0–0.1)
Basophils Relative: 0 %
Eosinophils Absolute: 0 10*3/uL (ref 0.0–0.5)
Eosinophils Relative: 0 %
HEMATOCRIT: 39.7 % (ref 36.0–46.0)
Hemoglobin: 12 g/dL (ref 12.0–15.0)
Immature Granulocytes: 1 %
Lymphocytes Relative: 10 %
Lymphs Abs: 1.6 10*3/uL (ref 0.7–4.0)
MCH: 27 pg (ref 26.0–34.0)
MCHC: 30.2 g/dL (ref 30.0–36.0)
MCV: 89.2 fL (ref 80.0–100.0)
Monocytes Absolute: 1.2 10*3/uL — ABNORMAL HIGH (ref 0.1–1.0)
Monocytes Relative: 8 %
Neutro Abs: 13.5 10*3/uL — ABNORMAL HIGH (ref 1.7–7.7)
Neutrophils Relative %: 81 %
Platelets: 203 10*3/uL (ref 150–400)
RBC: 4.45 MIL/uL (ref 3.87–5.11)
RDW: 15.1 % (ref 11.5–15.5)
WBC: 16.5 10*3/uL — ABNORMAL HIGH (ref 4.0–10.5)
nRBC: 0 % (ref 0.0–0.2)

## 2018-06-29 LAB — GLUCOSE, CAPILLARY
Glucose-Capillary: 118 mg/dL — ABNORMAL HIGH (ref 70–99)
Glucose-Capillary: 143 mg/dL — ABNORMAL HIGH (ref 70–99)
Glucose-Capillary: 122 mg/dL — ABNORMAL HIGH (ref 70–99)
Glucose-Capillary: 126 mg/dL — ABNORMAL HIGH (ref 70–99)

## 2018-06-29 LAB — MRSA PCR SCREENING: MRSA by PCR: NEGATIVE

## 2018-06-29 LAB — MAGNESIUM: Magnesium: 2.2 mg/dL (ref 1.7–2.4)

## 2018-06-29 LAB — STREP PNEUMONIAE URINARY ANTIGEN: Strep Pneumo Urinary Antigen: NEGATIVE

## 2018-06-29 MED ORDER — SODIUM CHLORIDE 0.9 % IV SOLN
1000.0000 mL | INTRAVENOUS | Status: DC
  Administered 2018-06-29 – 2018-06-30 (×2): 1000 mL via INTRAVENOUS

## 2018-06-29 NOTE — Progress Notes (Signed)
PROGRESS NOTE    Andrew AuJuanita D Tucci  UJW:[id number]  DOB: Oct 04, 1955  DOA: 06/28/2018 PCP: Mirna MiresHill, Gerald, MD   Brief Admission Hx: 63 y.o. female longtimer smoker with COPD and on continuous oxygen at home (has not been using) presented to ED with complaints of generalized weakness, productive cough with thick green yellow sputum that has persisted for past 2 days associated with malaise.   MDM/Assessment & Plan:   1. CAP with acute respiratory failure with hypoxemia - Pt is reporting that she is feeling better.  She is much less short of breath although she continues to have a productive cough.  She denies fever and chills at this time.   CTA chest negative for PE.  Also she is noted to have a history of a right lung mass which was not seen on CTA chest.    Start ambulation. 2. Essential hypertension - holding home meds for now as she has low normal BPs.  3. Sinus tachycardia -improving with treatment, secondary to infection, follow blood cultures, treating infection as above, continue IVFs for supportive care.  4. Leukocytosis -WBC trending down.  Continue to follow CBC with differential.   5. COPD - Duonebs ordered to be scheduled and will follow clinically for need for steroids.   6. Tobacco abuse - Pt counseled to avoid using tobacco and to stop cigarettes.  Pt verbalized understanding but does not seem interested in quitting.  Will provide nicotine patches to use while in hospital.   DVT Prophylaxis: lovenox  Code Status: Full   Family Communication: patient   Disposition Plan: Home when medically stabilized    Consultants:  n/a  Procedures:  n/a  Antimicrobials:  Ceftriaxone 2/2 >  Azithromycin 2/2 >  Subjective: Patient is awake and alert and reports that she has a productive cough with whitish to greenish sputum.  She reports that her shortness of breath is slowly starting to get better.  She is not eating well.  Objective: Vitals:   06/29/18 0700 06/29/18 0711  06/29/18 1321 06/29/18 1356  BP:  99/65  (!) 102/53  Pulse:  95  89  Resp:  (!) 23  (!) 21  Temp: 98.8 F (37.1 C)   98.9 F (37.2 C)  TempSrc: Axillary   Oral  SpO2:  90% 91% 92%  Weight: 83.2 kg     Height:        Intake/Output Summary (Last 24 hours) at 06/29/2018 1408 Last data filed at 06/29/2018 1234 Gross per 24 hour  Intake 2079.94 ml  Output 400 ml  Net 1679.94 ml   Filed Weights   06/28/18 1027 06/28/18 1643 06/29/18 0700  Weight: 81.6 kg 82.9 kg 83.2 kg   REVIEW OF SYSTEMS  As per history otherwise all reviewed and reported negative  Exam:  General exam: Awake, alert, no distress cooperative. Respiratory system: Left lower lobe Rales heard no wheezes heard. No increased work of breathing. Cardiovascular system: S1 & S2 heard. No JVD, murmurs, gallops, clicks or pedal edema. Gastrointestinal system: Abdomen is nondistended, soft and nontender. Normal bowel sounds heard. Central nervous system: Alert and oriented. No focal neurological deficits. Extremities: no CCE.  Data Reviewed: Basic Metabolic Panel: Recent Labs  Lab 06/28/18 1059 06/29/18 0429  NA 136 138  K 3.9 3.9  CL 102 107  CO2 20* 23  GLUCOSE 127* 117*  BUN 20 18  CREATININE 1.44* 0.86  CALCIUM 8.3* 7.8*  MG  --  2.2   Liver Function Tests: Recent Labs  Lab 06/28/18 1059 06/29/18 0429  AST 10* 10*  ALT 9 8  ALKPHOS 70 61  BILITOT 1.0 0.6  PROT 8.1 6.8  ALBUMIN 3.6 2.8*   No results for input(s): LIPASE, AMYLASE in the last 168 hours. No results for input(s): AMMONIA in the last 168 hours. CBC: Recent Labs  Lab 06/28/18 1059 06/29/18 0429  WBC 22.7* 16.5*  NEUTROABS 19.1* 13.5*  HGB 13.2 12.0  HCT 44.0 39.7  MCV 86.8 89.2  PLT 240 203   Cardiac Enzymes: No results for input(s): CKTOTAL, CKMB, CKMBINDEX, TROPONINI in the last 168 hours. CBG (last 3)  Recent Labs    06/28/18 2101 06/29/18 0754 06/29/18 1156  GLUCAP 114* 118* 143*   Recent Results (from the past 240  hour(s))  Blood Culture (routine x 2)     Status: None (Preliminary result)   Collection Time: 06/28/18 11:00 AM  Result Value Ref Range Status   Specimen Description   Final    BLOOD RIGHT HAND BOTTLES DRAWN AEROBIC AND ANAEROBIC   Special Requests   Final    Blood Culture results may not be optimal due to an excessive volume of blood received in culture bottles   Culture   Final    NO GROWTH 1 DAY Performed at [facility], [address] [place], [place] [address]    Report Status PENDING  Incomplete  Blood Culture (routine x 2)     Status: None (Preliminary result)   Collection Time: 06/28/18 11:03 AM  Result Value Ref Range Status   Specimen Description   Final    LEFT ANTECUBITAL BOTTLES DRAWN AEROBIC AND ANAEROBIC   Special Requests Blood Culture adequate volume  Final   Culture   Final    NO GROWTH 1 DAY Performed at [facility], [address] [place], [place] [address]    Report Status PENDING  Incomplete  MRSA PCR Screening     Status: None   Collection Time: 06/28/18  4:49 PM  Result Value Ref Range Status   MRSA by PCR NEGATIVE NEGATIVE Final    Comment:        The GeneXpert MRSA Assay (FDA approved for NASAL specimens only), is one component of a comprehensive MRSA colonization surveillance program. It is not intended to diagnose MRSA infection nor to guide or monitor treatment for MRSA infections. Performed at [facility], [address] [place], [place] [address]      Studies: Dg Chest 2 View  Result Date: 06/28/2018 CLINICAL DATA:  Cough. EXAM: CHEST - 2 VIEW COMPARISON:  CT scan and radiographs of August 08, 2017. FINDINGS: Stable cardiomegaly. No pneumothorax is noted. Right lung is unremarkable. Probable mild lingular opacity is noted concerning for pneumonia or atelectasis. No significant pleural effusion is noted. Bony thorax is unremarkable. IMPRESSION: Probable mild lingular opacity is noted concerning for pneumonia or atelectasis. Followup PA and  lateral chest X-ray is recommended in 3-4 weeks following trial of antibiotic therapy to ensure resolution and exclude underlying malignancy. Electronically Signed   By: Lupita RaiderJames  Green Jr, M.D.   On: 06/28/2018 11:45   Ct Angio Chest Pe W And/or Wo Contrast  Result Date: 06/28/2018 CLINICAL DATA:  Acute shortness of breath. EXAM: CT ANGIOGRAPHY CHEST WITH CONTRAST TECHNIQUE: Multidetector CT imaging of the chest was performed using the standard protocol during bolus administration of intravenous contrast. Multiplanar CT image reconstructions and MIPs were obtained to evaluate the vascular anatomy. CONTRAST:  80mL ISOVUE-370 IOPAMIDOL (ISOVUE-370) INJECTION 76% COMPARISON:  Radiographs  of same day.  CT scan of August 08, 2017. FINDINGS: Cardiovascular: Satisfactory opacification of the pulmonary arteries to the segmental level. No evidence of pulmonary embolism. Mild cardiomegaly is noted. No pericardial effusion. No evidence of thoracic aortic dissection or aneurysm. Mediastinum/Nodes: No enlarged mediastinal, hilar, or axillary lymph nodes. Thyroid gland, trachea, and esophagus demonstrate no significant findings. Lungs/Pleura: No pneumothorax is noted. Large left lower lobe airspace opacity is noted concerning for pneumonia or possibly atelectasis. No significant pleural effusion is noted. Right lung is clear. Upper Abdomen: No acute abnormality. Musculoskeletal: No chest wall abnormality. No acute or significant osseous findings. Review of the MIP images confirms the above findings. IMPRESSION: No definite evidence of pulmonary embolus. Large left lower lobe airspace opacity is noted most consistent with pneumonia or less likely atelectasis. Electronically Signed   By: Lupita Raider, M.D.   On: 06/28/2018 14:37   Dg Chest Port 1 View  Result Date: 06/29/2018 CLINICAL DATA:  Productive cough and weakness EXAM: PORTABLE CHEST 1 VIEW COMPARISON:  06/28/2018 FINDINGS: Cardiac shadow remains enlarged. Lungs are  well aerated bilaterally. Persistent left basilar consolidation is noted stable from the previous CT. New right basilar atelectasis is noted. No bony abnormality is seen. IMPRESSION: Persistent left basilar consolidation. New right basilar atelectasis. Electronically Signed   By: Alcide Clever M.D.   On: 06/29/2018 07:41     Scheduled Meds: . enoxaparin (LOVENOX) injection  40 mg Subcutaneous Q24H  . guaiFENesin  1,200 mg Oral BID  . insulin aspart  0-15 Units Subcutaneous TID WC  . insulin aspart  0-5 Units Subcutaneous QHS  . ipratropium-albuterol  3 mL Nebulization Q6H  . nicotine  21 mg Transdermal Daily   Continuous Infusions: . sodium chloride 75 mL/hr at 06/29/18 0821  . azithromycin 500 mg (06/29/18 1138)  . cefTRIAXone (ROCEPHIN)  IV 2 g (06/29/18 1052)    Active Problems:   CAP (community acquired pneumonia)   Tobacco abuse   Essential hypertension   Mass of upper lobe of right lung   Leukocytosis   Acute hypoxemic respiratory failure (HCC)   Sinus tachycardia   Chain smoker   COPD (chronic obstructive pulmonary disease) (HCC)   Time spent:   Standley Dakins, MD [facility] 06/29/2018, 2:08 PM    LOS: 1 day  How to contact the [facility] Attending or Consulting provider 7A - 7P or covering provider during after hours 7P -7A, for this patient?  1. Check the care team in [facility] and look for a) attending/consulting TRH provider listed and b) the [facility] team listed 2. Log into [email or web address] and use [place] universal password to access. If you do not have the password, please contact the hospital operator. 3. Locate the [facility] provider you are looking for under [facility] and page to a number that you can be directly reached. 4. If you still have difficulty reaching the provider, please page the [facility] (Director on Call) for the Hospitalists listed on amion for assistance.

## 2018-06-30 LAB — COMPREHENSIVE METABOLIC PANEL
ALT: 8 U/L (ref 0–44)
AST: 9 U/L — ABNORMAL LOW (ref 15–41)
Albumin: 2.6 g/dL — ABNORMAL LOW (ref 3.5–5.0)
Alkaline Phosphatase: 52 U/L (ref 38–126)
Anion gap: 6 (ref 5–15)
BUN: 12 mg/dL (ref 6–20)
CO2: 23 mmol/L (ref 22–32)
Calcium: 7.8 mg/dL — ABNORMAL LOW (ref 8.9–10.3)
Chloride: 110 mmol/L (ref 98–111)
Creatinine, Ser: 0.67 mg/dL (ref 0.44–1.00)
GFR calc Af Amer: >60 mL/min (ref >60)
GFR calc non Af Amer: >60 mL/min (ref >60)
Glucose, Bld: 120 mg/dL — ABNORMAL HIGH (ref 70–99)
POTASSIUM: 3.7 mmol/L (ref 3.5–5.1)
Sodium: 139 mmol/L (ref 135–145)
Total Bilirubin: 0.3 mg/dL (ref 0.3–1.2)
Total Protein: 6.4 g/dL — ABNORMAL LOW (ref 6.5–8.1)

## 2018-06-30 LAB — GLUCOSE, CAPILLARY
GLUCOSE-CAPILLARY: 107 mg/dL — AB (ref 70–99)
Glucose-Capillary: 100 mg/dL — ABNORMAL HIGH (ref 70–99)
Glucose-Capillary: 86 mg/dL (ref 70–99)

## 2018-06-30 LAB — HIV ANTIBODY (ROUTINE TESTING W REFLEX): HIV Screen 4th Generation wRfx: NONREACTIVE

## 2018-06-30 LAB — CBC WITH DIFFERENTIAL/PLATELET
Abs Immature Granulocytes: 0.07 10*3/uL (ref 0.00–0.07)
Basophils Absolute: 0 10*3/uL (ref 0.0–0.1)
Basophils Relative: 0 %
EOS ABS: 0.1 10*3/uL (ref 0.0–0.5)
Eosinophils Relative: 1 %
HCT: 37.8 % (ref 36.0–46.0)
Hemoglobin: 11.4 g/dL — ABNORMAL LOW (ref 12.0–15.0)
Immature Granulocytes: 1 %
Lymphocytes Relative: 18 %
Lymphs Abs: 1.4 10*3/uL (ref 0.7–4.0)
MCH: 26.5 pg (ref 26.0–34.0)
MCHC: 30.2 g/dL (ref 30.0–36.0)
MCV: 87.9 fL (ref 80.0–100.0)
Monocytes Absolute: 0.6 10*3/uL (ref 0.1–1.0)
Monocytes Relative: 8 %
Neutro Abs: 5.5 10*3/uL (ref 1.7–7.7)
Neutrophils Relative %: 72 %
Platelets: 223 10*3/uL (ref 150–400)
RBC: 4.3 MIL/uL (ref 3.87–5.11)
RDW: 15 % (ref 11.5–15.5)
WBC: 7.6 10*3/uL (ref 4.0–10.5)
nRBC: 0 % (ref 0.0–0.2)

## 2018-06-30 LAB — MAGNESIUM: Magnesium: 2.2 mg/dL (ref 1.7–2.4)

## 2018-06-30 MED ORDER — IPRATROPIUM-ALBUTEROL 0.5-2.5 (3) MG/3ML IN SOLN
3.0000 mL | Freq: Four times a day (QID) | RESPIRATORY_TRACT | Status: DC
  Administered 2018-06-30 (×2): 3 mL via RESPIRATORY_TRACT
  Filled 2018-06-30 (×2): qty 3

## 2018-06-30 MED ORDER — SODIUM CHLORIDE 0.9 % IV SOLN
1000.0000 mL | INTRAVENOUS | Status: DC
  Administered 2018-06-30 – 2018-07-01 (×2): 1000 mL via INTRAVENOUS

## 2018-06-30 MED ORDER — IPRATROPIUM-ALBUTEROL 0.5-2.5 (3) MG/3ML IN SOLN
3.0000 mL | Freq: Three times a day (TID) | RESPIRATORY_TRACT | Status: DC
  Administered 2018-06-30 – 2018-07-01 (×3): 3 mL via RESPIRATORY_TRACT
  Filled 2018-06-30 (×3): qty 3

## 2018-06-30 NOTE — Progress Notes (Signed)
Pt eating. Will return later to do nebulizer treatment

## 2018-06-30 NOTE — Progress Notes (Signed)
PROGRESS NOTE  Rita Jackson  MLJ:[id number]  DOB: 17-Jan-1956  DOA: 06/28/2018 PCP: Mirna Mires, MD  Brief Admission Hx: 63 y.o. female longtimer smoker with COPD and on continuous oxygen at home (has not been using) presented to ED with complaints of generalized weakness, productive cough with thick green yellow sputum that has persisted for past 2 days associated with malaise.   MDM/Assessment & Plan:   1. CAP with acute respiratory failure with hypoxemia -  She has less shortness of breath although she continues to have a productive cough.  She denies fever and chills at this time.   CTA chest negative for PE.  Also she is noted to have a history of a right lung mass which was not seen on CTA chest.    Start ambulation. PT evaluation requested.  Wean oxygen as able.  She remains in the hospital today because of her high oxygen requirements.  She is on 10 L high flow nasal cannula.  Trying to wean as able. 2. Essential hypertension - holding home meds for now as she has low normal BPs.  3. Sinus tachycardia -improved with supportive treatment, secondary to infection, follow blood cultures, treating infection as above.  4. Leukocytosis -WBC trending down with treatments.   5. COPD - Duonebs ordered.       6. Tobacco abuse - Pt counseled to avoid using tobacco and patient says she is not smoking at this time.   DVT Prophylaxis: lovenox  Code Status: Full   Family Communication: patient   Disposition Plan: Home in 1-2 days    Consultants:  n/a  Procedures:  n/a  Antimicrobials:  Ceftriaxone 2/2 >  Azithromycin 2/2 >  Subjective: Patient says she feels weak, she hasn't been out of bed, her nose if burning from the oxygen, she is having productive cough.  She remains on high flow nasal cannula at 10 L/min.    Objective: Vitals:   06/29/18 2116 06/30/18 0535 06/30/18 0839 06/30/18 1148  BP: 128/68 125/68    Pulse: 81 73  86  Resp: 19 18    Temp: (!) 97.5 F (36.4 C)  (!) 97.5 F (36.4 C)    TempSrc: Oral Oral    SpO2: 95% 97% 92% (!) 88%  Weight:      Height:        Intake/Output Summary (Last 24 hours) at 06/30/2018 1241 Last data filed at 06/30/2018 0900 Gross per 24 hour  Intake 2324.74 ml  Output -  Net 2324.74 ml   Filed Weights   06/28/18 1027 06/28/18 1643 06/29/18 0700  Weight: 81.6 kg 82.9 kg 83.2 kg   REVIEW OF SYSTEMS  As per history otherwise all reviewed and reported negative  Exam:  General exam: Awake, alert, no distress cooperative. Respiratory system: improving left lower lobe rales heard,  no wheezes heard. No increased work of breathing. Cardiovascular system: S1 & S2 heard. No JVD, murmurs, gallops, clicks or pedal edema. Gastrointestinal system: Abdomen is nondistended, soft and nontender. Normal bowel sounds heard. Central nervous system: Alert and oriented. No focal neurological deficits. Extremities: no CCE.  Data Reviewed: Basic Metabolic Panel: Recent Labs  Lab 06/28/18 1059 06/29/18 0429 06/30/18 0543  NA 136 138 139  K 3.9 3.9 3.7  CL 102 107 110  CO2 20* 23 23  GLUCOSE 127* 117* 120*  BUN 20 18 12   CREATININE 1.44* 0.86 0.67  CALCIUM 8.3* 7.8* 7.8*  MG  --  2.2 2.2   Liver Function Tests:  Recent Labs  Lab 06/28/18 1059 06/29/18 0429 06/30/18 0543  AST 10* 10* 9*  ALT 9 8 8   ALKPHOS 70 61 52  BILITOT 1.0 0.6 0.3  PROT 8.1 6.8 6.4*  ALBUMIN 3.6 2.8* 2.6*   No results for input(s): LIPASE, AMYLASE in the last 168 hours. No results for input(s): AMMONIA in the last 168 hours. CBC: Recent Labs  Lab 06/28/18 1059 06/29/18 0429 06/30/18 0543  WBC 22.7* 16.5* 7.6  NEUTROABS 19.1* 13.5* 5.5  HGB 13.2 12.0 11.4*  HCT 44.0 39.7 37.8  MCV 86.8 89.2 87.9  PLT 240 203 223   Cardiac Enzymes: No results for input(s): CKTOTAL, CKMB, CKMBINDEX, TROPONINI in the last 168 hours. CBG (last 3)  Recent Labs    06/29/18 2118 06/30/18 0722 06/30/18 1146  GLUCAP 126* 107* 100*   Recent Results  (from the past 240 hour(s))  Blood Culture (routine x 2)     Status: None (Preliminary result)   Collection Time: 06/28/18 11:00 AM  Result Value Ref Range Status   Specimen Description   Final    BLOOD RIGHT HAND BOTTLES DRAWN AEROBIC AND ANAEROBIC   Special Requests   Final    Blood Culture results may not be optimal due to an excessive volume of blood received in culture bottles   Culture   Final    NO GROWTH 2 DAYS Performed at [facility], [address] [place], [place] [address]    Report Status PENDING  Incomplete  Blood Culture (routine x 2)     Status: None (Preliminary result)   Collection Time: 06/28/18 11:03 AM  Result Value Ref Range Status   Specimen Description   Final    LEFT ANTECUBITAL BOTTLES DRAWN AEROBIC AND ANAEROBIC   Special Requests Blood Culture adequate volume  Final   Culture   Final    NO GROWTH 2 DAYS Performed at [facility], [address] [place], [place] [address]    Report Status PENDING  Incomplete  MRSA PCR Screening     Status: None   Collection Time: 06/28/18  4:49 PM  Result Value Ref Range Status   MRSA by PCR NEGATIVE NEGATIVE Final    Comment:        The GeneXpert MRSA Assay (FDA approved for NASAL specimens only), is one component of a comprehensive MRSA colonization surveillance program. It is not intended to diagnose MRSA infection nor to guide or monitor treatment for MRSA infections. Performed at [facility], [address] [place], [place] [address]      Studies: Ct Angio Chest Pe W And/or Wo Contrast  Result Date: 06/28/2018 CLINICAL DATA:  Acute shortness of breath. EXAM: CT ANGIOGRAPHY CHEST WITH CONTRAST TECHNIQUE: Multidetector CT imaging of the chest was performed using the standard protocol during bolus administration of intravenous contrast. Multiplanar CT image reconstructions and MIPs were obtained to evaluate the vascular anatomy. CONTRAST:  80mL ISOVUE-370 IOPAMIDOL (ISOVUE-370) INJECTION 76% COMPARISON:   Radiographs of same day.  CT scan of August 08, 2017. FINDINGS: Cardiovascular: Satisfactory opacification of the pulmonary arteries to the segmental level. No evidence of pulmonary embolism. Mild cardiomegaly is noted. No pericardial effusion. No evidence of thoracic aortic dissection or aneurysm. Mediastinum/Nodes: No enlarged mediastinal, hilar, or axillary lymph nodes. Thyroid gland, trachea, and esophagus demonstrate no significant findings. Lungs/Pleura: No pneumothorax is noted. Large left lower lobe airspace opacity is noted concerning for pneumonia or possibly atelectasis. No significant pleural effusion is noted. Right lung is clear. Upper Abdomen: No  acute abnormality. Musculoskeletal: No chest wall abnormality. No acute or significant osseous findings. Review of the MIP images confirms the above findings. IMPRESSION: No definite evidence of pulmonary embolus. Large left lower lobe airspace opacity is noted most consistent with pneumonia or less likely atelectasis. Electronically Signed   By: Lupita Raider, M.D.   On: 06/28/2018 14:37   Dg Chest Port 1 View  Result Date: 06/29/2018 CLINICAL DATA:  Productive cough and weakness EXAM: PORTABLE CHEST 1 VIEW COMPARISON:  06/28/2018 FINDINGS: Cardiac shadow remains enlarged. Lungs are well aerated bilaterally. Persistent left basilar consolidation is noted stable from the previous CT. New right basilar atelectasis is noted. No bony abnormality is seen. IMPRESSION: Persistent left basilar consolidation. New right basilar atelectasis. Electronically Signed   By: Alcide Clever M.D.   On: 06/29/2018 07:41   Scheduled Meds: . enoxaparin (LOVENOX) injection  40 mg Subcutaneous Q24H  . guaiFENesin  1,200 mg Oral BID  . insulin aspart  0-15 Units Subcutaneous TID WC  . insulin aspart  0-5 Units Subcutaneous QHS  . ipratropium-albuterol  3 mL Nebulization QID   Continuous Infusions: . sodium chloride    . azithromycin 500 mg (06/30/18 1030)  .  cefTRIAXone (ROCEPHIN)  IV 2 g (06/30/18 0800)   Active Problems:   CAP (community acquired pneumonia)   Tobacco abuse   Essential hypertension   Mass of upper lobe of right lung   Leukocytosis   Acute hypoxemic respiratory failure (HCC)   Sinus tachycardia   Chain smoker   COPD (chronic obstructive pulmonary disease) (HCC)  Time spent:   Standley Dakins, MD [facility] 06/30/2018, 12:41 PM    LOS: 2 days  How to contact the [facility] Attending or Consulting provider 7A - 7P or covering provider during after hours 7P -7A, for this patient?  1. Check the care team in [facility] and look for a) attending/consulting TRH provider listed and b) the [facility] team listed 2. Log into [email or web address] and use [place]'s universal password to access. If you do not have the password, please contact the hospital operator. 3. Locate the [facility] provider you are looking for under [facility] and page to a number that you can be directly reached. 4. If you still have difficulty reaching the provider, please page the [facility] (Director on Call) for the Hospitalists listed on amion for assistance.

## 2018-06-30 NOTE — Plan of Care (Signed)
  Problem: Education: Goal: Knowledge of General Education information will improve Description Including pain rating scale, medication(s)/side effects and non-pharmacologic comfort measures Outcome: Progressing   Problem: Health Behavior/Discharge Planning: Goal: Ability to manage health-related needs will improve Outcome: Progressing   Problem: Clinical Measurements: Goal: Ability to maintain clinical measurements within normal limits will improve Outcome: Progressing Goal: Will remain free from infection Outcome: Progressing Goal: Diagnostic test results will improve Outcome: Progressing Goal: Respiratory complications will improve Outcome: Progressing Goal: Cardiovascular complication will be avoided Outcome: Progressing   Problem: Activity: Goal: Risk for activity intolerance will decrease Outcome: Progressing   Problem: Nutrition: Goal: Adequate nutrition will be maintained Outcome: Progressing   Problem: Coping: Goal: Level of anxiety will decrease Outcome: Progressing   Problem: Elimination: Goal: Will not experience complications related to bowel motility Outcome: Progressing Goal: Will not experience complications related to urinary retention Outcome: Progressing   Problem: Pain Managment: Goal: General experience of comfort will improve Outcome: Progressing   Problem: Safety: Goal: Ability to remain free from injury will improve Outcome: Progressing   Problem: Skin Integrity: Goal: Risk for impaired skin integrity will decrease Outcome: Progressing   Problem: Breathing - Pneumonia Goal: Improved ventilation/oxygenation Outcome: Progressing

## 2018-06-30 NOTE — Evaluation (Addendum)
Physical Therapy Evaluation Patient Details Name: Rita Jackson MRN: [id number] DOB: Jul 06, 1955 Today's Date: 06/30/2018   History of Present Illness  Rita Jackson is a 63 y.o. female longtimer smoker with COPD and on continuous oxygen at home (has not been using) presented to ED with complaints of generalized weakness, productive cough with thick green yellow sputum that has persisted for past 2 days associated with malaise.  Pt says that she is getting SOB with ambulation and she reports occasional CP with deep coughing and deep breathing. She is not aware of fever but does report occasional chills.   She has a history of prior pneumonia and reports that this does feel similar.  She says that she is not aware of any known sick contacts.  She says that her primary complaint is the generalized malaise, cough and chest congestion.  She reports a brownish sputum production that is slightly different from her chronic bronchitis.  She denies wheezing.    Clinical Impression  Patient independent with bed mobility and transfers without using assistive device. Patient on 5 LPM initially with O2 sat 85-91% at rest and denies dizziness or lightheadedness. Patient ambulates on level surfaces, ascending and descending surfaces while on 6 LPM and O2 sat ranging from 79%-93% requiring 2 standing rest breaks with pursed lip breathing to increase O2 sat above 90% within 1 minute of rest. Patient continues to deny dizziness/lightheadedness with gait training. Patient left on 5 LPM in chair with call bell in lap and O2 sat 91%. Physical therapy evaluation completed, patient is at baseline except desaturation occurring at rest and with activity and no further PT services recommended at this time. Patient discharged to care of nursing for ambulation daily as tolerated for length of stay.     Follow Up Recommendations No PT follow up    Equipment Recommendations  None recommended by PT    Recommendations for  Other Services       Precautions / Restrictions Precautions Precautions: None;Other (comment) Precaution Comments: O2 saturation Restrictions Weight Bearing Restrictions: No      Mobility  Bed Mobility Overal bed mobility: Independent                Transfers Overall transfer level: Independent               General transfer comment: increased time  Ambulation/Gait Ambulation/Gait assistance: Modified independent (Device/Increase time) Gait Distance (Feet): 200 Feet Assistive device: None Gait Pattern/deviations: WFL(Within Functional Limits);Step-through pattern Gait velocity: slightly decreased   General Gait Details: step throught gait pattern with decreased heel-toe pattern, navigates ascending and descending surfaces, 2 standing rest breaks with pursed lip breathing, no shortness of breath noted, on 6L O2 during gait training with O2 sat 79-93%  Stairs            Wheelchair Mobility    Modified Rankin (Stroke Patients Only)       Balance Overall balance assessment: Independent                                           Pertinent Vitals/Pain Pain Assessment: No/denies pain    Home Living Family/patient expects to be discharged to:: Private residence Living Arrangements: Other relatives Available Help at Discharge: Family;Friend(s) Type of Home: House Home Access: Stairs to enter Entrance Stairs-Rails: None Entrance Stairs-Number of Steps: 1 Home Layout: One level Home Equipment: None  Prior Function Level of Independence: Independent         Comments: Patient reports independnet with ADLs, ambualtes community distances without assistive device, denies home O2 use, and currently employeed.     Hand Dominance        Extremity/Trunk Assessment   Upper Extremity Assessment Upper Extremity Assessment: Overall WFL for tasks assessed    Lower Extremity Assessment Lower Extremity Assessment: Overall WFL  for tasks assessed    Cervical / Trunk Assessment Cervical / Trunk Assessment: Normal  Communication   Communication: No difficulties  Cognition Arousal/Alertness: Awake/alert Behavior During Therapy: WFL for tasks assessed/performed Overall Cognitive Status: Within Functional Limits for tasks assessed                                        General Comments      Exercises     Assessment/Plan    PT Assessment Patent does not need any further PT services  PT Problem List         PT Treatment Interventions      PT Goals (Current goals can be found in the Care Plan section)  Acute Rehab PT Goals Patient Stated Goal: home with family PT Goal Formulation: With patient Time For Goal Achievement: 06/30/18 Potential to Achieve Goals: Good    Frequency     Barriers to discharge        Co-evaluation               AM-PAC PT "6 Clicks" Mobility  Outcome Measure Help needed turning from your back to your side while in a flat bed without using bedrails?: None Help needed moving from lying on your back to sitting on the side of a flat bed without using bedrails?: None Help needed moving to and from a bed to a chair (including a wheelchair)?: None Help needed standing up from a chair using your arms (e.g., wheelchair or bedside chair)?: None Help needed to walk in hospital room?: None Help needed climbing 3-5 steps with a railing? : None 6 Click Score: 24    End of Session Equipment Utilized During Treatment: Oxygen(6 LPM) Activity Tolerance: Patient tolerated treatment well Patient left: in chair;with call bell/phone within reach        Time: 1140-1200 PT Time Calculation (min) (ACUTE ONLY): 20 min   Charges:   PT Evaluation $PT Eval Moderate Complexity: 1 Mod PT Treatments $Gait Training: 8-22 mins        12:13 PM, 06/30/18 Domenick Bookbinder, DPT Physical Therapist with [facility] [phone] office

## 2018-07-01 DIAGNOSIS — J440 Chronic obstructive pulmonary disease with acute lower respiratory infection: Secondary | ICD-10-CM

## 2018-07-01 DIAGNOSIS — J181 Lobar pneumonia, unspecified organism: Secondary | ICD-10-CM

## 2018-07-01 DIAGNOSIS — A419 Sepsis, unspecified organism: Secondary | ICD-10-CM

## 2018-07-01 DIAGNOSIS — R918 Other nonspecific abnormal finding of lung field: Secondary | ICD-10-CM

## 2018-07-01 LAB — COMPREHENSIVE METABOLIC PANEL
ALT: 9 U/L (ref 0–44)
AST: 10 U/L — ABNORMAL LOW (ref 15–41)
Albumin: 2.6 g/dL — ABNORMAL LOW (ref 3.5–5.0)
Alkaline Phosphatase: 49 U/L (ref 38–126)
Anion gap: 5 (ref 5–15)
BUN: 9 mg/dL (ref 6–20)
CO2: 24 mmol/L (ref 22–32)
Calcium: 7.9 mg/dL — ABNORMAL LOW (ref 8.9–10.3)
Chloride: 110 mmol/L (ref 98–111)
Creatinine, Ser: 0.62 mg/dL (ref 0.44–1.00)
GFR calc Af Amer: >60 mL/min (ref >60)
GFR calc non Af Amer: >60 mL/min (ref >60)
Glucose, Bld: 104 mg/dL — ABNORMAL HIGH (ref 70–99)
Potassium: 4.2 mmol/L (ref 3.5–5.1)
Sodium: 139 mmol/L (ref 135–145)
TOTAL PROTEIN: 6.4 g/dL — AB (ref 6.5–8.1)
Total Bilirubin: 0.4 mg/dL (ref 0.3–1.2)

## 2018-07-01 LAB — GLUCOSE, CAPILLARY
GLUCOSE-CAPILLARY: 121 mg/dL — AB (ref 70–99)
Glucose-Capillary: 139 mg/dL — ABNORMAL HIGH (ref 70–99)
Glucose-Capillary: 101 mg/dL — ABNORMAL HIGH (ref 70–99)

## 2018-07-01 LAB — CBC WITH DIFFERENTIAL/PLATELET
Abs Immature Granulocytes: 0.05 10*3/uL (ref 0.00–0.07)
Basophils Absolute: 0 10*3/uL (ref 0.0–0.1)
Basophils Relative: 0 %
Eosinophils Absolute: 0.1 10*3/uL (ref 0.0–0.5)
Eosinophils Relative: 2 %
HCT: 37.9 % (ref 36.0–46.0)
Hemoglobin: 11.3 g/dL — ABNORMAL LOW (ref 12.0–15.0)
Immature Granulocytes: 1 %
Lymphocytes Relative: 27 %
Lymphs Abs: 1.3 10*3/uL (ref 0.7–4.0)
MCH: 26.3 pg (ref 26.0–34.0)
MCHC: 29.8 g/dL — ABNORMAL LOW (ref 30.0–36.0)
MCV: 88.1 fL (ref 80.0–100.0)
MONOS PCT: 12 %
Monocytes Absolute: 0.6 10*3/uL (ref 0.1–1.0)
Neutro Abs: 2.9 10*3/uL (ref 1.7–7.7)
Neutrophils Relative %: 58 %
Platelets: 242 10*3/uL (ref 150–400)
RBC: 4.3 MIL/uL (ref 3.87–5.11)
RDW: 14.9 % (ref 11.5–15.5)
WBC: 5 10*3/uL (ref 4.0–10.5)
nRBC: 0 % (ref 0.0–0.2)

## 2018-07-01 LAB — LEGIONELLA PNEUMOPHILA SEROGP 1 UR AG: L. pneumophila Serogp 1 Ur Ag: NEGATIVE

## 2018-07-01 MED ORDER — PREDNISONE 20 MG PO TABS: 40.0000 mg | ORAL_TABLET | Freq: Every day | ORAL | 0 refills | Status: AC

## 2018-07-01 MED ORDER — TIOTROPIUM BROMIDE MONOHYDRATE 18 MCG IN CAPS: 18.0000 ug | ORAL_CAPSULE | Freq: Every day | RESPIRATORY_TRACT | 2 refills | Status: DC

## 2018-07-01 MED ORDER — OMEPRAZOLE MAGNESIUM 20 MG PO TBEC: 20.0000 mg | DELAYED_RELEASE_TABLET | Freq: Every day | ORAL | 1 refills | Status: DC

## 2018-07-01 MED ORDER — AMOXICILLIN-POT CLAVULANATE 875-125 MG PO TABS
1.0000 | ORAL_TABLET | Freq: Two times a day (BID) | ORAL | Status: DC
  Administered 2018-07-01: 1 via ORAL
  Filled 2018-07-01: qty 1

## 2018-07-01 MED ORDER — AMOXICILLIN-POT CLAVULANATE 875-125 MG PO TABS: 1.0000 | ORAL_TABLET | Freq: Two times a day (BID) | ORAL | 0 refills | Status: DC

## 2018-07-01 MED ORDER — HYDROCOD POLST-CPM POLST ER 10-8 MG/5ML PO SUER: 5.0000 mL | Freq: Two times a day (BID) | ORAL | 0 refills | Status: DC | PRN

## 2018-07-01 NOTE — Plan of Care (Signed)
  Problem: Education: Goal: Knowledge of General Education information will improve Description Including pain rating scale, medication(s)/side effects and non-pharmacologic comfort measures Outcome: Adequate for Discharge   Problem: Health Behavior/Discharge Planning: Goal: Ability to manage health-related needs will improve Outcome: Adequate for Discharge   Problem: Clinical Measurements: Goal: Ability to maintain clinical measurements within normal limits will improve Outcome: Adequate for Discharge Goal: Will remain free from infection Outcome: Adequate for Discharge Goal: Diagnostic test results will improve Outcome: Adequate for Discharge Goal: Respiratory complications will improve Outcome: Adequate for Discharge Goal: Cardiovascular complication will be avoided Outcome: Adequate for Discharge   Problem: Activity: Goal: Risk for activity intolerance will decrease Outcome: Adequate for Discharge   Problem: Nutrition: Goal: Adequate nutrition will be maintained Outcome: Adequate for Discharge   Problem: Coping: Goal: Level of anxiety will decrease Outcome: Adequate for Discharge   Problem: Elimination: Goal: Will not experience complications related to bowel motility Outcome: Adequate for Discharge Goal: Will not experience complications related to urinary retention Outcome: Adequate for Discharge   Problem: Pain Managment: Goal: General experience of comfort will improve Outcome: Adequate for Discharge   Problem: Safety: Goal: Ability to remain free from injury will improve Outcome: Adequate for Discharge   Problem: Skin Integrity: Goal: Risk for impaired skin integrity will decrease Outcome: Adequate for Discharge   Problem: Breathing - Pneumonia Goal: Improved ventilation/oxygenation Outcome: Adequate for Discharge

## 2018-07-01 NOTE — Care Management (Signed)
Pt has home O2 with [facility]. She is on 3L at baseline, was able to ambulate on 4L without desaturating. Pt may return home with same set up. Needs port device for transport. Bonita Quin, [facility] rep, will deliver tank to pt room.

## 2018-07-01 NOTE — Discharge Summary (Signed)
Physician Discharge Summary  Rita Jackson HBZ:[id number] DOB: 05-Apr-1956 DOA: 06/28/2018  PCP: Iona Beard, MD  Admit date: 06/28/2018 Discharge date: 07/01/2018  Time spent: 35 minutes  Recommendations for Outpatient Follow-up:  1. Repeat BMET to follow electrolytes and renal function 2. Please reassess oxygen supplementing needs and continue weaning down as tolerated. 3. Repeat CXR in 4-6 weeks to assure resolution of infiltrates. 4. Reassess BP and adjust antihypertensive regimen as needed. 5. Continue assisting patient with tobacco cessation. 6. Follow CBGs/A1c and further adjust hypoglycemic regimen as needed. 7. Patient will benefit of outpatient follow-up with pulmonologist for repeat PFTs and further adjustment of her COPD management.   Discharge Diagnoses:  Active Problems:   CAP (community acquired pneumonia)   Tobacco abuse   Essential hypertension   Mass of upper lobe of right lung   Leukocytosis   Acute hypoxemic respiratory failure (HCC)   Sinus tachycardia   Chain smoker   COPD (chronic obstructive pulmonary disease) (HCC)   Sepsis without acute organ dysfunction ([place])   Discharge Condition: stable and improved. Discharge home with instructions to follow up with PCP in 10 days.  Diet recommendation: heart healthy diet  Filed Weights   06/28/18 1027 06/28/18 1643 06/29/18 0700  Weight: 81.6 kg 82.9 kg 83.2 kg    History of present illness:  As per H&P written by Dr. Wynetta Emery on 06/28/2018 63 y.o. female longtimer smoker with COPD and on continuous oxygen at home (has not been using) presented to ED with complaints of generalized weakness, productive cough with thick green yellow sputum that has persisted for past 2 days associated with malaise.  Pt says that she is getting SOB with ambulation and she reports occasional CP with deep coughing and deep breathing. She is not aware of fever but does report occasional chills.   She has a history of prior pneumonia and  reports that this does feel similar.  She says that she is not aware of any known sick contacts.  She says that her primary complaint is the generalized malaise, cough and chest congestion.  She reports a brownish sputum production that is slightly different from her chronic bronchitis.  She denies wheezing.  ED Course: on arrival patient was noted to be hypoxic with pulse ox of 72% and she was placed on supplemental oxygen with rapid recovery.  She was noted on CXR to have findings consistent with pneumonia.  Her WBC was elevated at 22.7.  She had a creatinine of 1.44. She was noted to have a normal lactic acid. She had sinus tachycardia and normal blood pressure.  She will be sent for CTA to rule out PE.    Hospital Course:  1-sepsis in the setting of community-acquired pneumonia causing acute on chronic respiratory failure with hypoxia -At discharge sepsis criteria resolved -patient discharge home on oxygen supplementation, steroids course over 5 days, oral antibiotics and spiriva. -Will recommend outpatient follow-up with pulmonologist for repeat PFTs and received further adjustment on her inhaler management.  2-type 2 diabetes -started on metformin 555m BID -follow CBG's and A1C, adjust hypoglycemic regimen as needed -patient advise to follow modified carb diet.  3-essential HTN -BP stable and rising -resume antihypertensive regimen at adjusted dose at discharge. -patient instructed to follow low sodium diet  4-tobacco abuse -I have discussed tobacco cessation with the patient.  I have counseled the patient regarding the negative impacts of continued tobacco use including but not limited to lung cancer, COPD, and cardiovascular disease.  I have discussed  alternatives to tobacco and modalities that may help facilitate tobacco cessation including but not limited to biofeedback, hypnosis, and medications.  Total time spent with tobacco counseling was 4 minutes -nicoderm  recommended.  5-GERD -discharge on PPI daily  6-lung mass: seen on CXR -evaluation with CT angio of her chest demonstrated no lung masses.  7-AKI: present on admission -pre-renal azotemia and volume dpletion with sepsis presentation. -resolved after IVF"s given -repeat renal function at follow up visit.  Procedures: See below for x-ray reports.  Consultations:  None   Discharge Exam: Vitals:   07/01/18 1342 07/01/18 1446  BP:  (!) 131/56  Pulse:  82  Resp:  19  Temp:  98.5 F (36.9 C)  SpO2: 92% 90%    General: afebrile, no CP, no nausea, no vomiting. With improved breathing and good O2 sat on 4L [place]. Cardiovascular: S1 and S2, no rubs, no gallops Respiratory: improved aor movement, no using accessory muscles, normal resp effort. posiive rhonchi and mild end exp wheezing. Abd: soft, NT, ND, Positive BS.  Discharge Instructions   Discharge Instructions    Diet - low sodium heart healthy   Complete by:  As directed    Discharge instructions   Complete by:  As directed    Take medications as prescribed Follow-up with PCP in 10 days Keep yourself well-hydrated Please wean oxygen supplementation as tolerated while pacing yourself; currently using 4 L 24/7. Modify carbohydrate diet. Complete antibiotics as prescribed.     Allergies as of 07/01/2018   No Known Allergies     Medication List    STOP taking these medications   azithromycin 250 MG tablet Commonly known as:  ZITHROMAX     TAKE these medications   amLODipine 5 MG tablet Commonly known as:  NORVASC Take 5 mg by mouth daily.   amoxicillin-clavulanate 875-125 MG tablet Commonly known as:  AUGMENTIN Take 1 tablet by mouth every 12 (twelve) hours.   blood glucose meter kit and supplies Dispense based on patient and insurance preference. Use up to four times daily as directed. (FOR ICD-10 E10.9, E11.9).   chlorpheniramine-HYDROcodone 10-8 MG/5ML Suer Commonly known as:  TUSSIONEX Take 5 mLs by  mouth every 12 (twelve) hours as needed (severe coughing spells.).   ipratropium-albuterol 0.5-2.5 (3) MG/3ML Soln Commonly known as:  DUONEB Take 3 mLs by nebulization every 4 (four) hours as needed (wheezing and shortness of breath, coughing spells).   metFORMIN 500 MG tablet Commonly known as:  GLUCOPHAGE Take 1 tablet (500 mg total) by mouth 2 (two) times daily with a meal.   omeprazole 20 MG tablet Commonly known as:  PRILOSEC OTC Take 1 tablet (20 mg total) by mouth daily.   OXYGEN Inhale 2 L into the lungs continuous.   predniSONE 20 MG tablet Commonly known as:  DELTASONE Take 2 tablets (40 mg total) by mouth daily with breakfast for 5 days.   tiotropium 18 MCG inhalation capsule Commonly known as:  SPIRIVA HANDIHALER Place 1 capsule (18 mcg total) into inhaler and inhale daily.   VIRTUSSIN A/C 100-10 MG/5ML syrup Generic drug:  guaiFENesin-codeine Take 10 mLs by mouth every 6 (six) hours as needed.      No Known Allergies Follow-up Information    Iona Beard, MD. Schedule an appointment as soon as possible for a visit in 10 day(s).   Specialty:  Family Medicine Contact information: [address] [place] [address] [phone]  The results of significant diagnostics from this hospitalization (including imaging, microbiology, ancillary and laboratory) are listed below for reference.    Significant Diagnostic Studies: Dg Chest 2 View  Result Date: 06/28/2018 CLINICAL DATA:  Cough. EXAM: CHEST - 2 VIEW COMPARISON:  CT scan and radiographs of August 08, 2017. FINDINGS: Stable cardiomegaly. No pneumothorax is noted. Right lung is unremarkable. Probable mild lingular opacity is noted concerning for pneumonia or atelectasis. No significant pleural effusion is noted. Bony thorax is unremarkable. IMPRESSION: Probable mild lingular opacity is noted concerning for pneumonia or atelectasis. Followup PA and lateral chest X-ray is recommended in 3-4  weeks following trial of antibiotic therapy to ensure resolution and exclude underlying malignancy. Electronically Signed   By: Marijo Conception, M.D.   On: 06/28/2018 11:45   Ct Angio Chest Pe W And/or Wo Contrast  Result Date: 06/28/2018 CLINICAL DATA:  Acute shortness of breath. EXAM: CT ANGIOGRAPHY CHEST WITH CONTRAST TECHNIQUE: Multidetector CT imaging of the chest was performed using the standard protocol during bolus administration of intravenous contrast. Multiplanar CT image reconstructions and MIPs were obtained to evaluate the vascular anatomy. CONTRAST:  43m ISOVUE-370 IOPAMIDOL (ISOVUE-370) INJECTION 76% COMPARISON:  Radiographs of same day.  CT scan of August 08, 2017. FINDINGS: Cardiovascular: Satisfactory opacification of the pulmonary arteries to the segmental level. No evidence of pulmonary embolism. Mild cardiomegaly is noted. No pericardial effusion. No evidence of thoracic aortic dissection or aneurysm. Mediastinum/Nodes: No enlarged mediastinal, hilar, or axillary lymph nodes. Thyroid gland, trachea, and esophagus demonstrate no significant findings. Lungs/Pleura: No pneumothorax is noted. Large left lower lobe airspace opacity is noted concerning for pneumonia or possibly atelectasis. No significant pleural effusion is noted. Right lung is clear. Upper Abdomen: No acute abnormality. Musculoskeletal: No chest wall abnormality. No acute or significant osseous findings. Review of the MIP images confirms the above findings. IMPRESSION: No definite evidence of pulmonary embolus. Large left lower lobe airspace opacity is noted most consistent with pneumonia or less likely atelectasis. Electronically Signed   By: JMarijo Conception M.D.   On: 06/28/2018 14:37   Dg Chest Port 1 View  Result Date: 06/29/2018 CLINICAL DATA:  Productive cough and weakness EXAM: PORTABLE CHEST 1 VIEW COMPARISON:  06/28/2018 FINDINGS: Cardiac shadow remains enlarged. Lungs are well aerated bilaterally. Persistent left  basilar consolidation is noted stable from the previous CT. New right basilar atelectasis is noted. No bony abnormality is seen. IMPRESSION: Persistent left basilar consolidation. New right basilar atelectasis. Electronically Signed   By: MInez CatalinaM.D.   On: 06/29/2018 07:41    Microbiology: Recent Results (from the past 240 hour(s))  Blood Culture (routine x 2)     Status: None (Preliminary result)   Collection Time: 06/28/18 11:00 AM  Result Value Ref Range Status   Specimen Description   Final    BLOOD RIGHT HAND BOTTLES DRAWN AEROBIC AND ANAEROBIC   Special Requests   Final    Blood Culture results may not be optimal due to an excessive volume of blood received in culture bottles   Culture   Final    NO GROWTH 3 DAYS [facility] [address], [place] [address]   Report Status PENDING  Incomplete  Blood Culture (routine x 2)     Status: None (Preliminary result)   Collection Time: 06/28/18 11:03 AM  Result Value Ref Range Status   Specimen Description   Final    LEFT ANTECUBITAL BOTTLES DRAWN AEROBIC AND ANAEROBIC  Special Requests Blood Culture adequate volume  Final   Culture   Final    NO GROWTH 3 DAYS Performed at [facility], [address] [place], [place] [address]    Report Status PENDING  Incomplete  MRSA PCR Screening     Status: None   Collection Time: 06/28/18  4:49 PM  Result Value Ref Range Status   MRSA by PCR NEGATIVE NEGATIVE Final    Comment:        The GeneXpert MRSA Assay (FDA approved for NASAL specimens only), is one component of a comprehensive MRSA colonization surveillance program. It is not intended to diagnose MRSA infection nor to guide or monitor treatment for MRSA infections. Performed at [facility], [address], [place], [place] [address]      Labs: Basic Metabolic Panel: Recent Labs  Lab 06/28/18 1059 06/29/18 0429 06/30/18 0543 07/01/18 0607  NA 136 138 139 139  K 3.9 3.9 3.7 4.2  CL 102  107 110 110  CO2 20* '23 23 24  ' GLUCOSE 127* 117* 120* 104*  BUN '20 18 12 9  ' CREATININE 1.44* 0.86 0.67 0.62  CALCIUM 8.3* 7.8* 7.8* 7.9*  MG  --  2.2 2.2  --    Liver Function Tests: Recent Labs  Lab 06/28/18 1059 06/29/18 0429 06/30/18 0543 07/01/18 0607  AST 10* 10* 9* 10*  ALT '9 8 8 9  ' ALKPHOS 70 61 52 49  BILITOT 1.0 0.6 0.3 0.4  PROT 8.1 6.8 6.4* 6.4*  ALBUMIN 3.6 2.8* 2.6* 2.6*   No results for input(s): LIPASE, AMYLASE in the last 168 hours. No results for input(s): AMMONIA in the last 168 hours. CBC: Recent Labs  Lab 06/28/18 1059 06/29/18 0429 06/30/18 0543 07/01/18 0607  WBC 22.7* 16.5* 7.6 5.0  NEUTROABS 19.1* 13.5* 5.5 2.9  HGB 13.2 12.0 11.4* 11.3*  HCT 44.0 39.7 37.8 37.9  MCV 86.8 89.2 87.9 88.1  PLT 240 203 223 242    CBG: Recent Labs  Lab 06/30/18 1146 06/30/18 1651 06/30/18 2226 07/01/18 0739 07/01/18 1110  GLUCAP 100* 86 139* 121* 101*     Signed:  Barton Dubois MD.  [facility] 07/01/2018, 4:36 PM

## 2018-07-01 NOTE — Progress Notes (Signed)
   Patient Saturations on 4 Liters of oxygen while Ambulating = 90-93%

## 2018-07-03 LAB — CULTURE, BLOOD (ROUTINE X 2)
Culture: NO GROWTH
Culture: NO GROWTH
Special Requests: ADEQUATE

## 2018-07-06 DIAGNOSIS — J449 Chronic obstructive pulmonary disease, unspecified: Secondary | ICD-10-CM | POA: Diagnosis not present

## 2018-07-06 DIAGNOSIS — J189 Pneumonia, unspecified organism: Secondary | ICD-10-CM | POA: Diagnosis not present

## 2018-07-06 DIAGNOSIS — E118 Type 2 diabetes mellitus with unspecified complications: Secondary | ICD-10-CM | POA: Diagnosis not present

## 2018-07-06 DIAGNOSIS — I10 Essential (primary) hypertension: Secondary | ICD-10-CM | POA: Diagnosis not present

## 2018-07-20 DIAGNOSIS — J441 Chronic obstructive pulmonary disease with (acute) exacerbation: Secondary | ICD-10-CM | POA: Diagnosis not present

## 2018-07-25 DIAGNOSIS — J189 Pneumonia, unspecified organism: Secondary | ICD-10-CM | POA: Diagnosis not present

## 2018-08-03 DIAGNOSIS — J441 Chronic obstructive pulmonary disease with (acute) exacerbation: Secondary | ICD-10-CM | POA: Diagnosis not present

## 2018-08-03 DIAGNOSIS — I10 Essential (primary) hypertension: Secondary | ICD-10-CM | POA: Diagnosis not present

## 2018-08-03 DIAGNOSIS — E1169 Type 2 diabetes mellitus with other specified complication: Secondary | ICD-10-CM | POA: Diagnosis not present

## 2018-08-23 DIAGNOSIS — J449 Chronic obstructive pulmonary disease, unspecified: Secondary | ICD-10-CM | POA: Diagnosis not present

## 2018-08-23 DIAGNOSIS — J189 Pneumonia, unspecified organism: Secondary | ICD-10-CM | POA: Diagnosis not present

## 2018-09-24 DIAGNOSIS — J189 Pneumonia, unspecified organism: Secondary | ICD-10-CM | POA: Diagnosis not present

## 2018-09-24 DIAGNOSIS — J449 Chronic obstructive pulmonary disease, unspecified: Secondary | ICD-10-CM | POA: Diagnosis not present

## 2018-10-25 DIAGNOSIS — J189 Pneumonia, unspecified organism: Secondary | ICD-10-CM | POA: Diagnosis not present

## 2018-10-25 DIAGNOSIS — J449 Chronic obstructive pulmonary disease, unspecified: Secondary | ICD-10-CM | POA: Diagnosis not present

## 2018-10-26 DIAGNOSIS — J449 Chronic obstructive pulmonary disease, unspecified: Secondary | ICD-10-CM | POA: Diagnosis not present

## 2018-10-26 DIAGNOSIS — Z7189 Other specified counseling: Secondary | ICD-10-CM | POA: Diagnosis not present

## 2018-10-26 DIAGNOSIS — E1169 Type 2 diabetes mellitus with other specified complication: Secondary | ICD-10-CM | POA: Diagnosis not present

## 2018-10-26 DIAGNOSIS — I10 Essential (primary) hypertension: Secondary | ICD-10-CM | POA: Diagnosis not present

## 2018-11-24 DIAGNOSIS — J449 Chronic obstructive pulmonary disease, unspecified: Secondary | ICD-10-CM | POA: Diagnosis not present

## 2018-11-24 DIAGNOSIS — J189 Pneumonia, unspecified organism: Secondary | ICD-10-CM | POA: Diagnosis not present

## 2018-12-25 DIAGNOSIS — J449 Chronic obstructive pulmonary disease, unspecified: Secondary | ICD-10-CM | POA: Diagnosis not present

## 2018-12-25 DIAGNOSIS — J189 Pneumonia, unspecified organism: Secondary | ICD-10-CM | POA: Diagnosis not present

## 2019-01-25 DIAGNOSIS — J449 Chronic obstructive pulmonary disease, unspecified: Secondary | ICD-10-CM | POA: Diagnosis not present

## 2019-01-25 DIAGNOSIS — J189 Pneumonia, unspecified organism: Secondary | ICD-10-CM | POA: Diagnosis not present

## 2019-01-26 DIAGNOSIS — Z7189 Other specified counseling: Secondary | ICD-10-CM | POA: Diagnosis not present

## 2019-01-26 DIAGNOSIS — I10 Essential (primary) hypertension: Secondary | ICD-10-CM | POA: Diagnosis not present

## 2019-01-26 DIAGNOSIS — J449 Chronic obstructive pulmonary disease, unspecified: Secondary | ICD-10-CM | POA: Diagnosis not present

## 2019-01-26 DIAGNOSIS — E1169 Type 2 diabetes mellitus with other specified complication: Secondary | ICD-10-CM | POA: Diagnosis not present

## 2019-02-24 DIAGNOSIS — J189 Pneumonia, unspecified organism: Secondary | ICD-10-CM | POA: Diagnosis not present

## 2019-02-24 DIAGNOSIS — J449 Chronic obstructive pulmonary disease, unspecified: Secondary | ICD-10-CM | POA: Diagnosis not present

## 2019-03-27 DIAGNOSIS — J189 Pneumonia, unspecified organism: Secondary | ICD-10-CM | POA: Diagnosis not present

## 2019-03-27 DIAGNOSIS — J449 Chronic obstructive pulmonary disease, unspecified: Secondary | ICD-10-CM | POA: Diagnosis not present

## 2019-04-26 DIAGNOSIS — J449 Chronic obstructive pulmonary disease, unspecified: Secondary | ICD-10-CM | POA: Diagnosis not present

## 2019-04-26 DIAGNOSIS — J189 Pneumonia, unspecified organism: Secondary | ICD-10-CM | POA: Diagnosis not present

## 2019-05-03 DIAGNOSIS — E119 Type 2 diabetes mellitus without complications: Secondary | ICD-10-CM | POA: Diagnosis not present

## 2019-05-03 DIAGNOSIS — I10 Essential (primary) hypertension: Secondary | ICD-10-CM | POA: Diagnosis not present

## 2019-05-03 DIAGNOSIS — E118 Type 2 diabetes mellitus with unspecified complications: Secondary | ICD-10-CM | POA: Diagnosis not present

## 2019-05-03 DIAGNOSIS — R079 Chest pain, unspecified: Secondary | ICD-10-CM | POA: Diagnosis not present

## 2019-05-03 DIAGNOSIS — E1169 Type 2 diabetes mellitus with other specified complication: Secondary | ICD-10-CM | POA: Diagnosis not present

## 2019-05-03 DIAGNOSIS — E785 Hyperlipidemia, unspecified: Secondary | ICD-10-CM | POA: Diagnosis not present

## 2019-05-03 DIAGNOSIS — J449 Chronic obstructive pulmonary disease, unspecified: Secondary | ICD-10-CM | POA: Diagnosis not present

## 2019-05-27 DIAGNOSIS — J449 Chronic obstructive pulmonary disease, unspecified: Secondary | ICD-10-CM | POA: Diagnosis not present

## 2019-05-27 DIAGNOSIS — J189 Pneumonia, unspecified organism: Secondary | ICD-10-CM | POA: Diagnosis not present

## 2019-06-15 DIAGNOSIS — I10 Essential (primary) hypertension: Secondary | ICD-10-CM | POA: Diagnosis not present

## 2019-06-15 DIAGNOSIS — J449 Chronic obstructive pulmonary disease, unspecified: Secondary | ICD-10-CM | POA: Diagnosis not present

## 2019-06-15 DIAGNOSIS — E1169 Type 2 diabetes mellitus with other specified complication: Secondary | ICD-10-CM | POA: Diagnosis not present

## 2019-06-15 IMAGING — DX DG CHEST 2V
2 series · 2 of 2 positions shown · non-contrast
Comparison: Chest CTA 06/23/2017 and earlier.

CLINICAL DATA: 62-year-old female with shortness weakness of
breath. Weakness for 1 week.

EXAM:
CHEST - 2 VIEW

[chest pa]
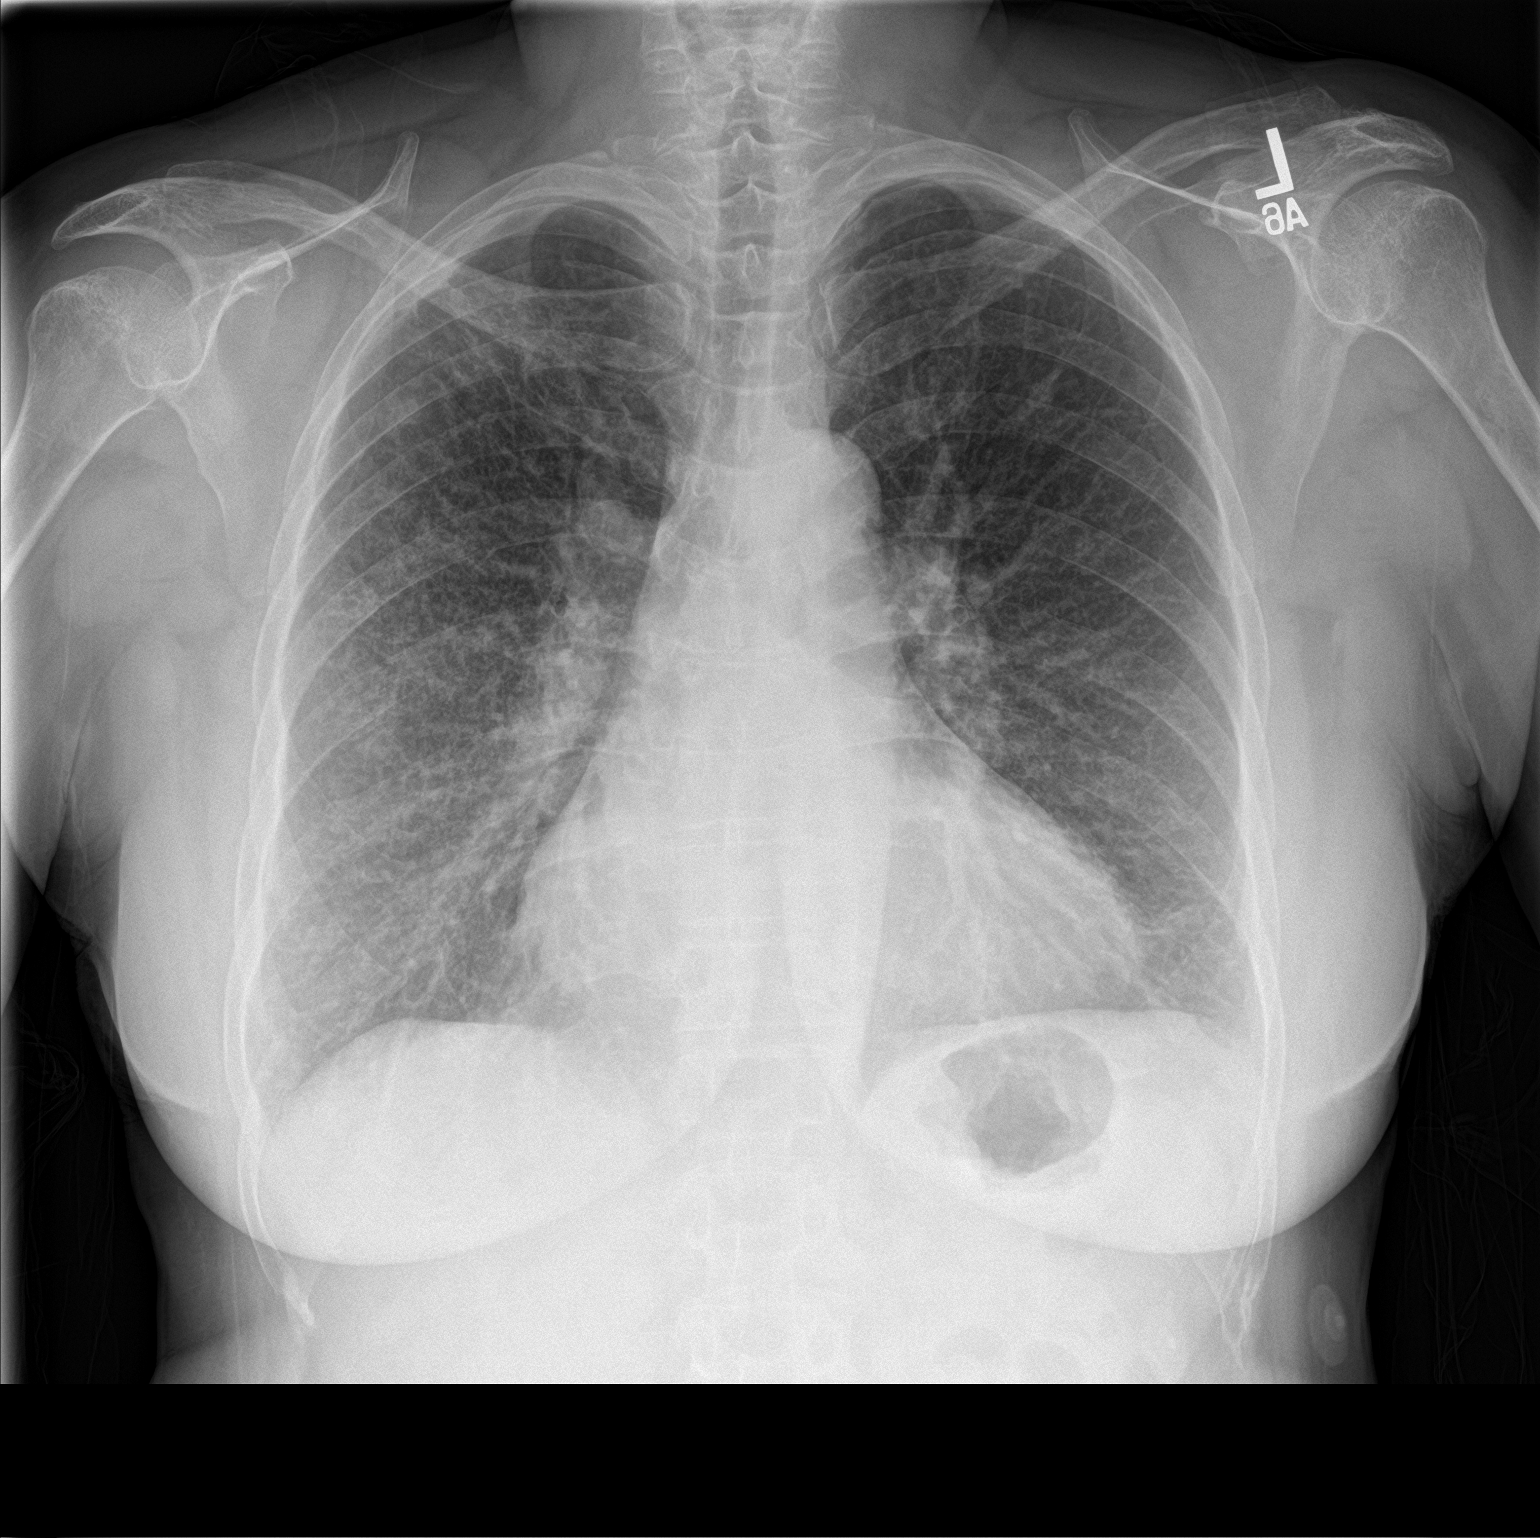

[chest lat]
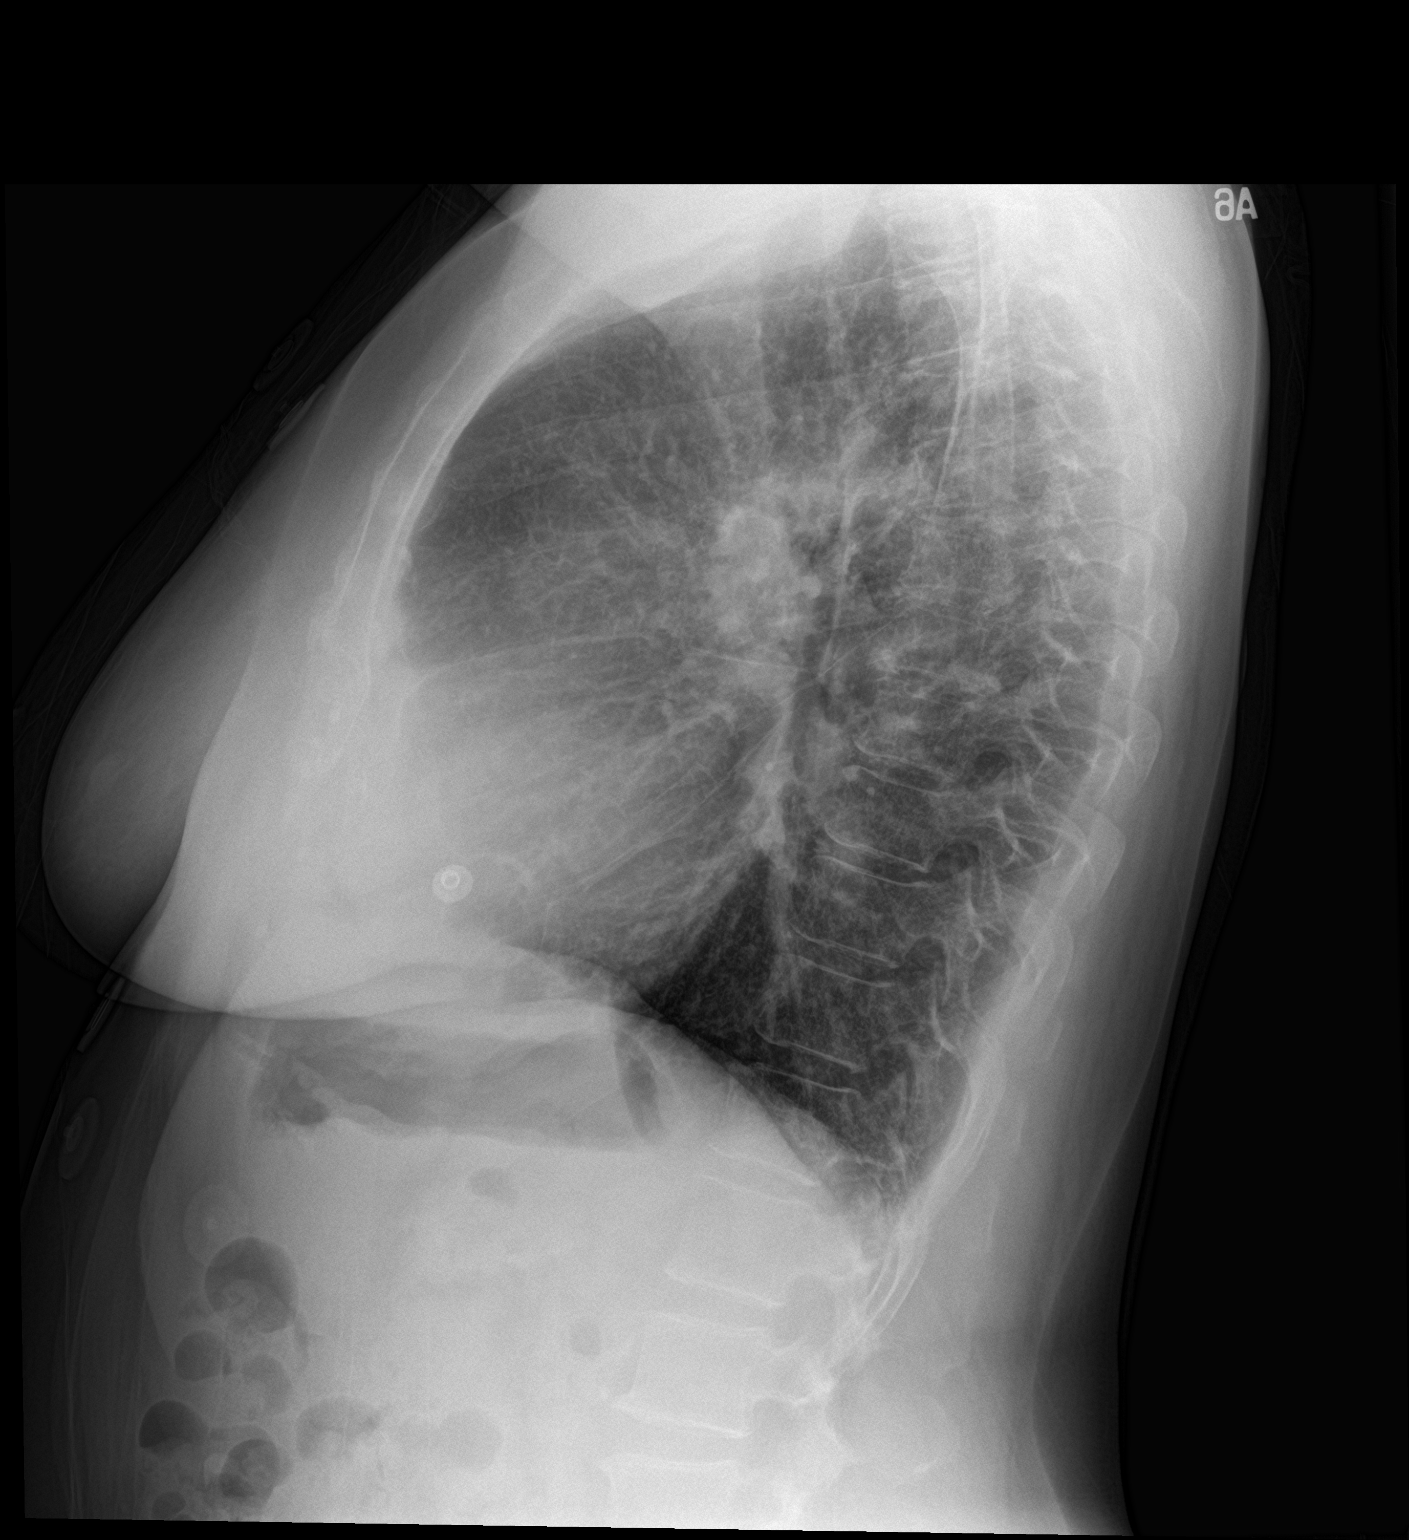

[2 of 2 positions shown; findings below may reference images not displayed]

FINDINGS: Virtually resolved confluent pulmonary opacity in the right upper
lobe, right middle lobe, and left lower lobe demonstrated on
06/23/2017. Stable lung volumes. Continued diffuse bilateral
pulmonary interstitial opacity, but this appears chronic. Stable
cardiac size and mediastinal contours. Visualized tracheal air
column is within normal limits. No pneumothorax. No pleural
effusion. No new confluent pulmonary opacity. No acute osseous
abnormality identified. Negative visible bowel gas pattern.
IMPRESSION: 1. Bilateral pneumonia seen in [REDACTED] appears resolved.
2. Underlying chronic lung disease. No superimposed acute findings
are identified.

## 2019-06-15 IMAGING — CT CT ANGIO CHEST
2 of 6 series · 18 of 36 positions shown · IV contrast (iopamidol)
Comparison: CXR 08/08/2017 and chest CT 06/23/2017

CLINICAL DATA: Generalized weakness and lethargy x1 week. Recent
diagnosis pneumonia. Current smoker.

EXAM:
CT ANGIOGRAPHY CHEST WITH CONTRAST
TECHNIQUE: Multidetector CT imaging of the chest was performed using the
standard protocol during bolus administration of intravenous
contrast. Multiplanar CT image reconstructions and MIPs were
obtained to evaluate the vascular anatomy.
CONTRAST:  70 cc [id number] IOPAMIDOL ([id number]) INJECTION 76%

[Series 7: pe thins · axial · 0.75mm/px · z∈[+1121,+1394]mm · 17 of 309 slices shown]
[im 18/309  lung]
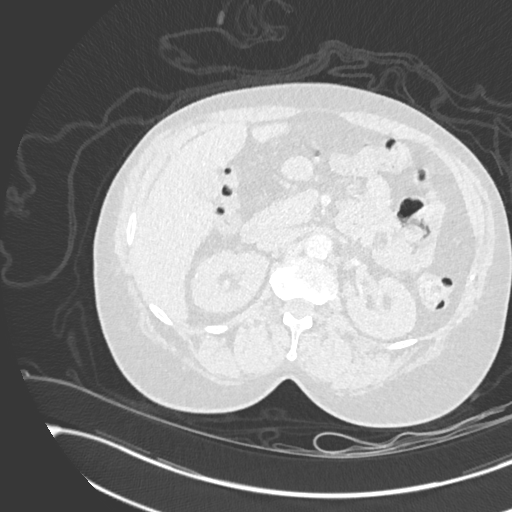
[im 35/309  mediastinal]
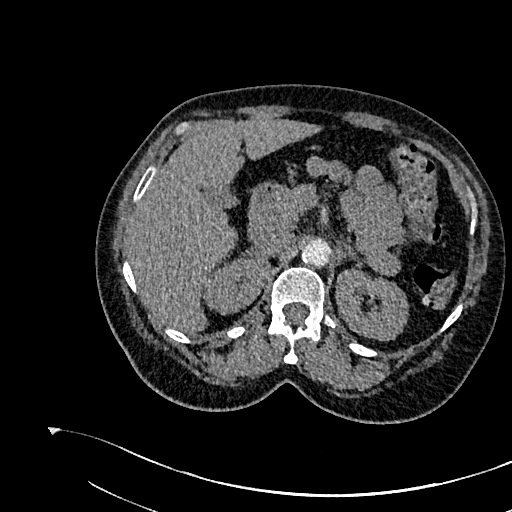
[im 52/309  lung]
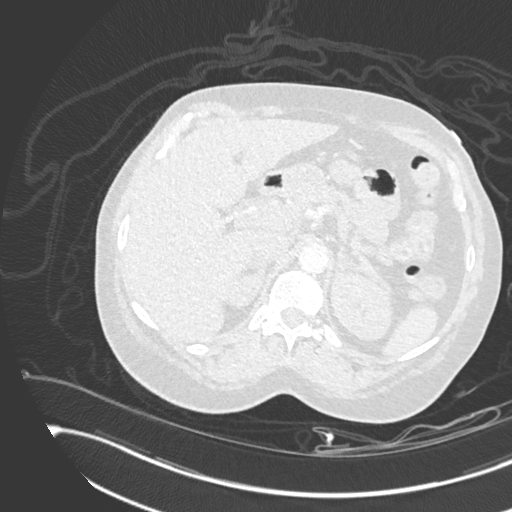
[im 69/309  mediastinal]
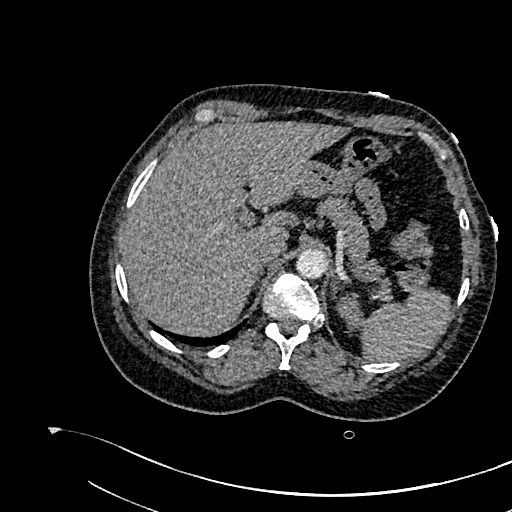
[im 86/309  lung]
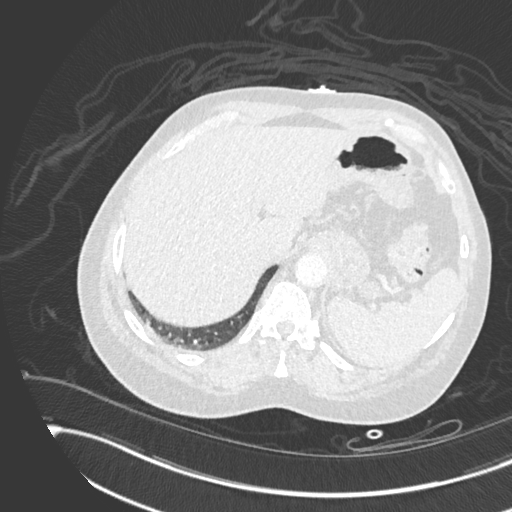
[im 103/309  mediastinal]
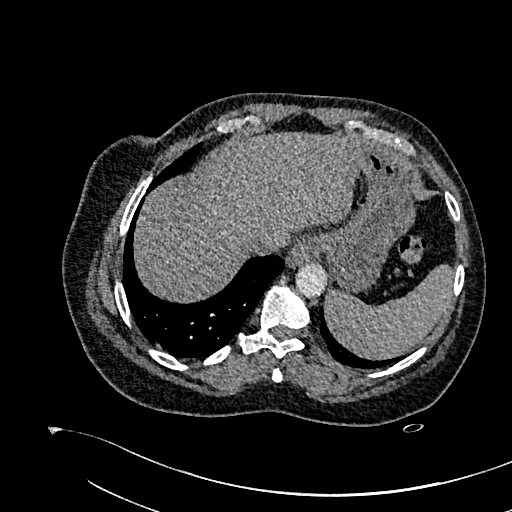
[im 120/309  lung]
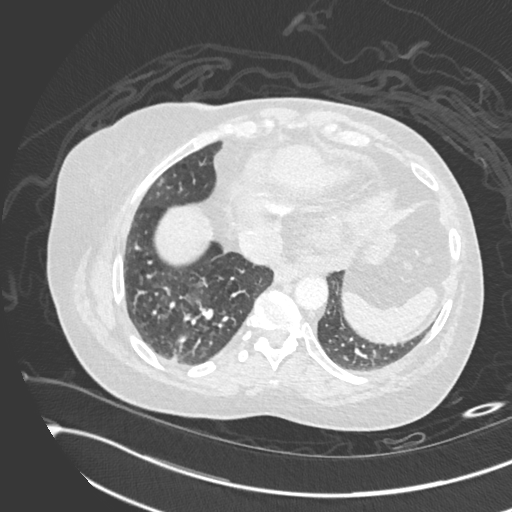
[im 137/309  mediastinal]
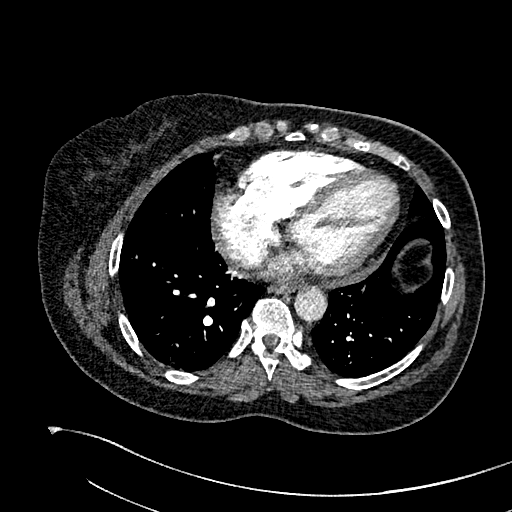
[im 155/309  lung]
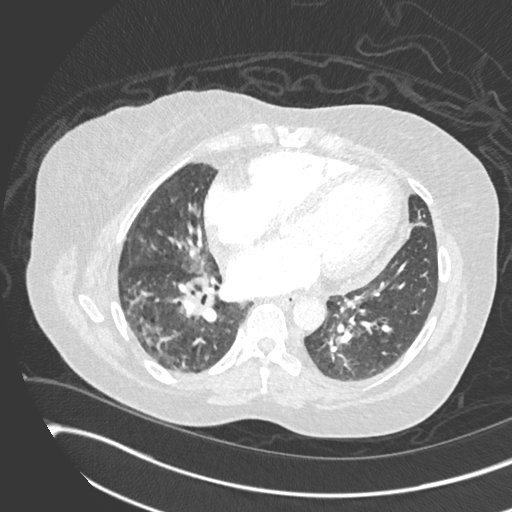
[im 172/309  mediastinal]
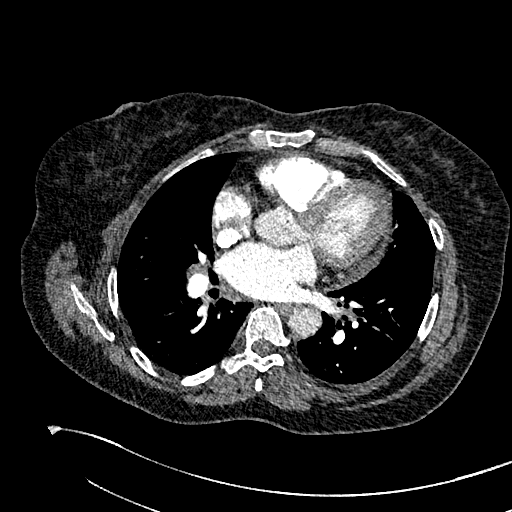
[im 189/309  lung]
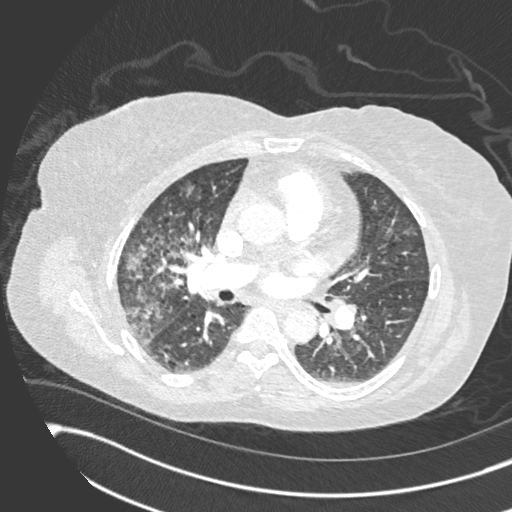
[im 206/309  mediastinal]
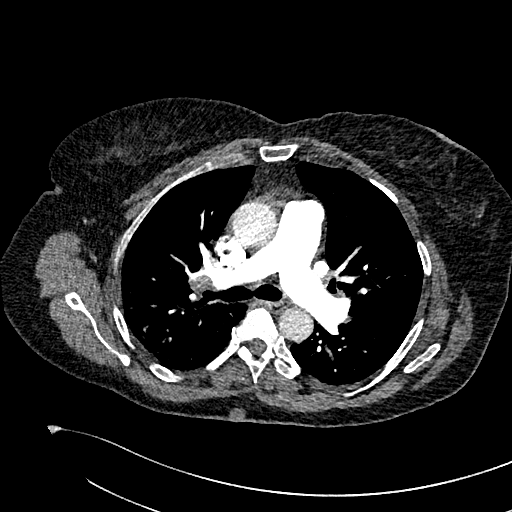
[im 223/309  lung]
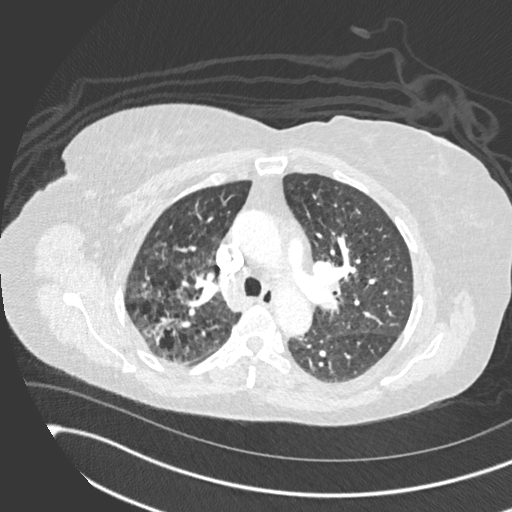
[im 240/309  mediastinal]
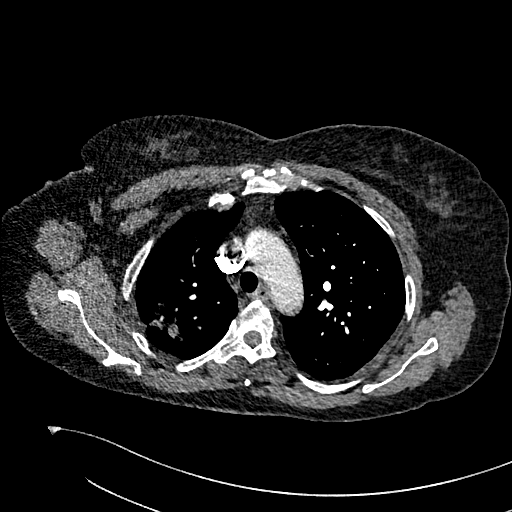
[im 257/309  lung]
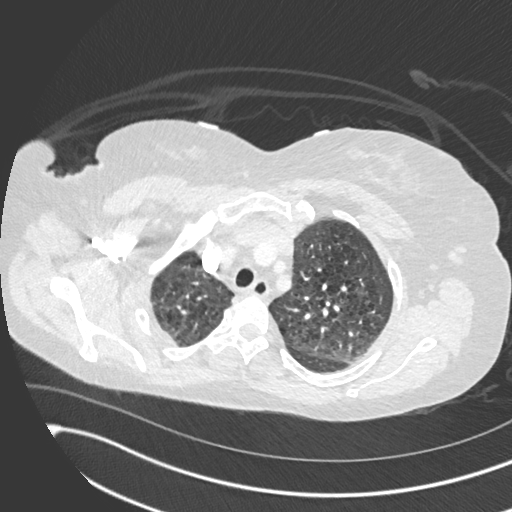
[im 274/309  mediastinal]
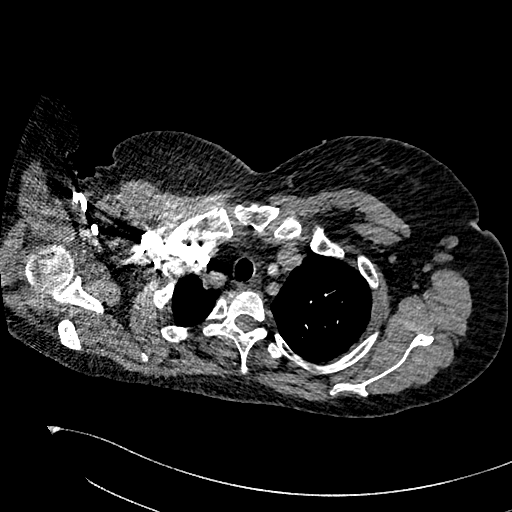
[im 291/309  lung]
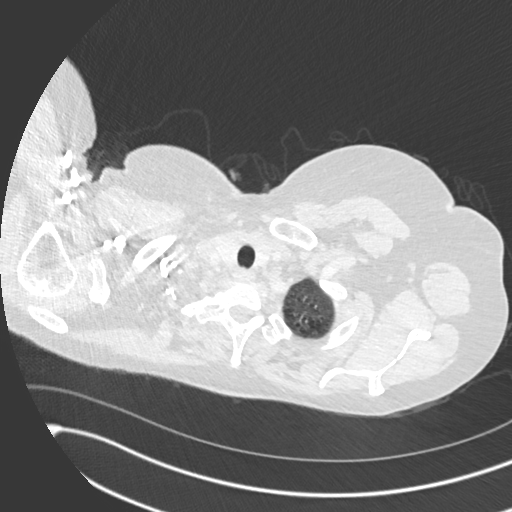

[Series 8: pe 2mm cor · coronal · 0.61mm/px · 1 of 129 slices shown]
[im 65/129  mediastinal]
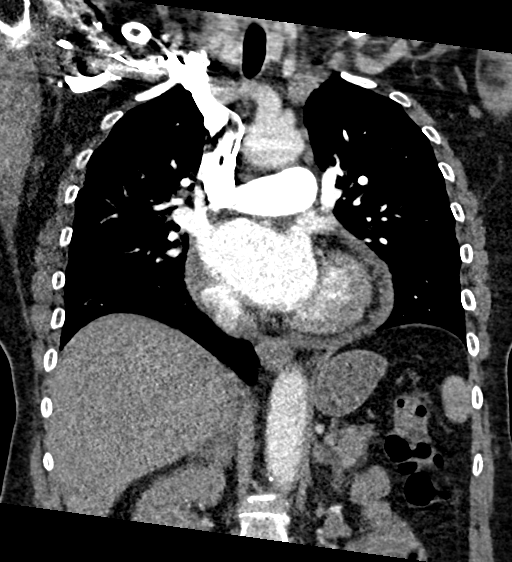

[18 of 36 positions shown; findings below may reference images not displayed]

FINDINGS: Cardiovascular: Normal branch pattern of the great vessels. Minimal
thoracic aortic atherosclerosis without aneurysm or dissection. Mild
ectasia of the ascending thoracic aorta to 3.6 cm. Good
opacification of the pulmonary arteries without acute pulmonary
embolus. Stable cardiomegaly with small pericardial effusion.

Mediastinum/Nodes: Stable right hilar lymphadenopathy likely
reactive given right-sided pneumonic consolidations. The largest are
currently 13 mm short axis. Patent trachea and mainstem bronchi.
Unremarkable CT appearance of the esophagus.

Lungs/Pleura: Partial resolution of right upper and middle lobe
pneumonia in the setting of centrilobular emphysema. Less masslike
appearance and consolidation in the right upper lobe since prior.
Near complete clearing of right-sided small pleural effusion and
left basilar atelectasis. Minimal subpleural atelectasis is seen at
the right lung base. Faint superior segment right upper lobe
airspace opacities with tree-in-bud configuration are new since
prior associated with peribronchial thickening. Mild peribronchial
thickening is also seen on the left.

Upper Abdomen: No acute abnormality.

Musculoskeletal: No chest wall abnormality. No acute or significant
osseous findings.

Review of the MIP images confirms the above findings.
IMPRESSION: 1. Clearing of small right effusion and partial clearing of masslike
opacity in the right upper lobe though still present. Findings
likely represent stigmata of pneumonia with reactive right hilar
lymphadenopathy redemonstrated. Follow-up to complete resolution
however is recommended to exclude neoplasm. Repeat imaging in 2-3
months is suggested unless clinical situation warrants an earlier
return.
2. Bilateral peribronchial thickening with tiny tree-in-bud
opacities, new in the right lower lobe superior segment that likely
reflect stigmata of bronchiolitis.
3. Stable cardiomegaly.  No acute pulmonary embolus.

Aortic Atherosclerosis (GREIP-0T7.7) and Emphysema (GREIP-OR5.5).

## 2019-06-27 DIAGNOSIS — J449 Chronic obstructive pulmonary disease, unspecified: Secondary | ICD-10-CM | POA: Diagnosis not present

## 2019-06-27 DIAGNOSIS — J189 Pneumonia, unspecified organism: Secondary | ICD-10-CM | POA: Diagnosis not present

## 2019-07-25 DIAGNOSIS — J449 Chronic obstructive pulmonary disease, unspecified: Secondary | ICD-10-CM | POA: Diagnosis not present

## 2019-07-25 DIAGNOSIS — J189 Pneumonia, unspecified organism: Secondary | ICD-10-CM | POA: Diagnosis not present

## 2019-08-25 DIAGNOSIS — J189 Pneumonia, unspecified organism: Secondary | ICD-10-CM | POA: Diagnosis not present

## 2019-08-25 DIAGNOSIS — J449 Chronic obstructive pulmonary disease, unspecified: Secondary | ICD-10-CM | POA: Diagnosis not present

## 2019-09-04 ENCOUNTER — Ambulatory Visit: Payer: BLUE CROSS/BLUE SHIELD | Attending: Internal Medicine

## 2019-09-04 DIAGNOSIS — Z23 Encounter for immunization: Secondary | ICD-10-CM

## 2019-09-04 NOTE — Progress Notes (Signed)
   Covid-19 Vaccination Clinic  Name:  Rita Jackson    MRN: [id number] DOB: [birth year]  09/04/2019  [name] was observed post Covid-19 immunization for 15 minutes without incident. She was provided with Vaccine Information Sheet and instruction to access the V-Safe system.   [name] was instructed to call 911 with any severe reactions post vaccine: Marland Kitchen Difficulty breathing  . Swelling of face and throat  . A fast heartbeat  . A bad rash all over body  . Dizziness and weakness   Immunizations Administered    Name Date Dose VIS Date Route   Moderna COVID-19 Vaccine 09/04/2019  9:35 AM 0.5 mL 04/27/2019 Intramuscular   Manufacturer: Moderna   Lot: [id number]   NDC: 88337-445-14

## 2019-09-18 DIAGNOSIS — E1169 Type 2 diabetes mellitus with other specified complication: Secondary | ICD-10-CM | POA: Diagnosis not present

## 2019-09-18 DIAGNOSIS — E119 Type 2 diabetes mellitus without complications: Secondary | ICD-10-CM | POA: Diagnosis not present

## 2019-09-18 DIAGNOSIS — I10 Essential (primary) hypertension: Secondary | ICD-10-CM | POA: Diagnosis not present

## 2019-09-18 DIAGNOSIS — E785 Hyperlipidemia, unspecified: Secondary | ICD-10-CM | POA: Diagnosis not present

## 2019-09-18 DIAGNOSIS — E118 Type 2 diabetes mellitus with unspecified complications: Secondary | ICD-10-CM | POA: Diagnosis not present

## 2019-09-18 DIAGNOSIS — J449 Chronic obstructive pulmonary disease, unspecified: Secondary | ICD-10-CM | POA: Diagnosis not present

## 2019-09-24 DIAGNOSIS — J449 Chronic obstructive pulmonary disease, unspecified: Secondary | ICD-10-CM | POA: Diagnosis not present

## 2019-09-24 DIAGNOSIS — J189 Pneumonia, unspecified organism: Secondary | ICD-10-CM | POA: Diagnosis not present

## 2019-10-02 ENCOUNTER — Ambulatory Visit: Payer: BLUE CROSS/BLUE SHIELD | Attending: Internal Medicine

## 2019-10-02 DIAGNOSIS — Z23 Encounter for immunization: Secondary | ICD-10-CM

## 2019-10-02 NOTE — Progress Notes (Signed)
   Covid-19 Vaccination Clinic  Name:  Rita Jackson    MRN: [id number] DOB: [birth year]  10/02/2019  [name] was observed post Covid-19 immunization for 15 minutes without incident. She was provided with Vaccine Information Sheet and instruction to access the V-Safe system.   [name] was instructed to call 911 with any severe reactions post vaccine: Marland Kitchen Difficulty breathing  . Swelling of face and throat  . A fast heartbeat  . A bad rash all over body  . Dizziness and weakness   Immunizations Administered    Name Date Dose VIS Date Route   Moderna COVID-19 Vaccine 10/02/2019  9:28 AM 0.5 mL 04/2019 Intramuscular   Manufacturer: Moderna   Lot: [id number]   NDC: 79217-837-54

## 2019-10-25 DIAGNOSIS — J189 Pneumonia, unspecified organism: Secondary | ICD-10-CM | POA: Diagnosis not present

## 2019-10-25 DIAGNOSIS — J449 Chronic obstructive pulmonary disease, unspecified: Secondary | ICD-10-CM | POA: Diagnosis not present

## 2019-11-24 DIAGNOSIS — J189 Pneumonia, unspecified organism: Secondary | ICD-10-CM | POA: Diagnosis not present

## 2019-11-24 DIAGNOSIS — J449 Chronic obstructive pulmonary disease, unspecified: Secondary | ICD-10-CM | POA: Diagnosis not present

## 2019-12-25 DIAGNOSIS — I10 Essential (primary) hypertension: Secondary | ICD-10-CM | POA: Diagnosis not present

## 2019-12-25 DIAGNOSIS — J189 Pneumonia, unspecified organism: Secondary | ICD-10-CM | POA: Diagnosis not present

## 2019-12-25 DIAGNOSIS — J449 Chronic obstructive pulmonary disease, unspecified: Secondary | ICD-10-CM | POA: Diagnosis not present

## 2019-12-25 DIAGNOSIS — B353 Tinea pedis: Secondary | ICD-10-CM | POA: Diagnosis not present

## 2019-12-25 DIAGNOSIS — E1169 Type 2 diabetes mellitus with other specified complication: Secondary | ICD-10-CM | POA: Diagnosis not present

## 2019-12-27 DIAGNOSIS — J449 Chronic obstructive pulmonary disease, unspecified: Secondary | ICD-10-CM | POA: Diagnosis not present

## 2019-12-27 DIAGNOSIS — E1169 Type 2 diabetes mellitus with other specified complication: Secondary | ICD-10-CM | POA: Diagnosis not present

## 2019-12-27 DIAGNOSIS — I10 Essential (primary) hypertension: Secondary | ICD-10-CM | POA: Diagnosis not present

## 2019-12-27 DIAGNOSIS — E785 Hyperlipidemia, unspecified: Secondary | ICD-10-CM | POA: Diagnosis not present

## 2020-01-25 ENCOUNTER — Encounter (HOSPITAL_COMMUNITY): Payer: Self-pay | Admitting: *Deleted

## 2020-01-25 ENCOUNTER — Emergency Department (HOSPITAL_COMMUNITY)
Admission: EM | Admit: 2020-01-25 | Discharge: 2020-01-25 | Disposition: A | Discharge status: Home / Self Care | Payer: BC Managed Care – PPO | Attending: Emergency Medicine | Admitting: Emergency Medicine

## 2020-01-25 ENCOUNTER — Emergency Department (HOSPITAL_COMMUNITY): Payer: BC Managed Care – PPO

## 2020-01-25 ENCOUNTER — Other Ambulatory Visit: Payer: Self-pay

## 2020-01-25 DIAGNOSIS — I10 Essential (primary) hypertension: Secondary | ICD-10-CM | POA: Insufficient documentation

## 2020-01-25 DIAGNOSIS — E119 Type 2 diabetes mellitus without complications: Secondary | ICD-10-CM | POA: Insufficient documentation

## 2020-01-25 DIAGNOSIS — Z7984 Long term (current) use of oral hypoglycemic drugs: Secondary | ICD-10-CM | POA: Diagnosis not present

## 2020-01-25 DIAGNOSIS — Z79899 Other long term (current) drug therapy: Secondary | ICD-10-CM | POA: Insufficient documentation

## 2020-01-25 DIAGNOSIS — R5383 Other fatigue: Secondary | ICD-10-CM | POA: Diagnosis not present

## 2020-01-25 DIAGNOSIS — J189 Pneumonia, unspecified organism: Secondary | ICD-10-CM | POA: Diagnosis not present

## 2020-01-25 DIAGNOSIS — J80 Acute respiratory distress syndrome: Secondary | ICD-10-CM | POA: Diagnosis not present

## 2020-01-25 DIAGNOSIS — U071 COVID-19: Secondary | ICD-10-CM | POA: Diagnosis not present

## 2020-01-25 DIAGNOSIS — F17210 Nicotine dependence, cigarettes, uncomplicated: Secondary | ICD-10-CM | POA: Insufficient documentation

## 2020-01-25 DIAGNOSIS — J449 Chronic obstructive pulmonary disease, unspecified: Secondary | ICD-10-CM | POA: Diagnosis not present

## 2020-01-25 DIAGNOSIS — R531 Weakness: Secondary | ICD-10-CM | POA: Diagnosis not present

## 2020-01-25 HISTORY — DX: Type 2 diabetes mellitus without complications: E11.9

## 2020-01-25 HISTORY — DX: Essential (primary) hypertension: I10

## 2020-01-25 LAB — SARS CORONAVIRUS 2 BY RT PCR (HOSPITAL ORDER, PERFORMED IN ~~LOC~~ HOSPITAL LAB): SARS Coronavirus 2: POSITIVE — AB

## 2020-01-25 LAB — URINALYSIS, ROUTINE W REFLEX MICROSCOPIC
Bilirubin Urine: NEGATIVE
Glucose, UA: NEGATIVE mg/dL
Hgb urine dipstick: NEGATIVE
Ketones, ur: NEGATIVE mg/dL
Leukocytes,Ua: NEGATIVE
Nitrite: NEGATIVE
Protein, ur: NEGATIVE mg/dL
Specific Gravity, Urine: 1.021 (ref 1.005–1.030)
pH: 5 (ref 5.0–8.0)

## 2020-01-25 LAB — CBC WITH DIFFERENTIAL/PLATELET
Abs Immature Granulocytes: 0.02 10*3/uL (ref 0.00–0.07)
Basophils Absolute: 0 10*3/uL (ref 0.0–0.1)
Basophils Relative: 0 %
Eosinophils Absolute: 0.1 10*3/uL (ref 0.0–0.5)
Eosinophils Relative: 1 %
HCT: 44.4 % (ref 36.0–46.0)
Hemoglobin: 13.5 g/dL (ref 12.0–15.0)
Immature Granulocytes: 0 %
Lymphocytes Relative: 18 %
Lymphs Abs: 1.4 10*3/uL (ref 0.7–4.0)
MCH: 26.9 pg (ref 26.0–34.0)
MCHC: 30.4 g/dL (ref 30.0–36.0)
MCV: 88.6 fL (ref 80.0–100.0)
Monocytes Absolute: 1 10*3/uL (ref 0.1–1.0)
Monocytes Relative: 14 %
Neutro Abs: 5 10*3/uL (ref 1.7–7.7)
Neutrophils Relative %: 67 %
Platelets: 214 10*3/uL (ref 150–400)
RBC: 5.01 MIL/uL (ref 3.87–5.11)
RDW: 15.2 % (ref 11.5–15.5)
WBC: 7.6 10*3/uL (ref 4.0–10.5)
nRBC: 0 % (ref 0.0–0.2)

## 2020-01-25 LAB — CBG MONITORING, ED: Glucose-Capillary: 126 mg/dL — ABNORMAL HIGH (ref 70–99)

## 2020-01-25 LAB — BASIC METABOLIC PANEL
Anion gap: 9 (ref 5–15)
BUN: 11 mg/dL (ref 8–23)
CO2: 23 mmol/L (ref 22–32)
Calcium: 8.4 mg/dL — ABNORMAL LOW (ref 8.9–10.3)
Chloride: 103 mmol/L (ref 98–111)
Creatinine, Ser: 0.97 mg/dL (ref 0.44–1.00)
GFR calc Af Amer: >60 mL/min (ref >60)
GFR calc non Af Amer: >60 mL/min (ref >60)
Glucose, Bld: 116 mg/dL — ABNORMAL HIGH (ref 70–99)
Potassium: 4.4 mmol/L (ref 3.5–5.1)
Sodium: 135 mmol/L (ref 135–145)

## 2020-01-25 MED ORDER — FAMOTIDINE IN NACL 20-0.9 MG/50ML-% IV SOLN: 20.0000 mg | Freq: Once | INTRAVENOUS | Status: DC | PRN

## 2020-01-25 MED ORDER — DIPHENHYDRAMINE HCL 50 MG/ML IJ SOLN: 50.0000 mg | Freq: Once | INTRAMUSCULAR | Status: DC | PRN

## 2020-01-25 MED ORDER — SODIUM CHLORIDE 0.9 % IV SOLN
1200.0000 mg | Freq: Once | INTRAVENOUS | Status: AC
  Administered 2020-01-25: 1200 mg via INTRAVENOUS
  Filled 2020-01-25: qty 10

## 2020-01-25 MED ORDER — SODIUM CHLORIDE 0.9 % IV SOLN: INTRAVENOUS | Status: DC | PRN

## 2020-01-25 MED ORDER — EPINEPHRINE 0.3 MG/0.3ML IJ SOAJ: 0.3000 mg | Freq: Once | INTRAMUSCULAR | Status: DC | PRN

## 2020-01-25 MED ORDER — METHYLPREDNISOLONE SODIUM SUCC 125 MG IJ SOLR: 125.0000 mg | Freq: Once | INTRAMUSCULAR | Status: DC | PRN

## 2020-01-25 MED ORDER — ALBUTEROL SULFATE HFA 108 (90 BASE) MCG/ACT IN AERS: 2.0000 | INHALATION_SPRAY | Freq: Once | RESPIRATORY_TRACT | Status: DC | PRN

## 2020-01-25 MED ORDER — DEXAMETHASONE SODIUM PHOSPHATE 10 MG/ML IJ SOLN
10.0000 mg | Freq: Once | INTRAMUSCULAR | Status: AC
  Administered 2020-01-25: 10 mg via INTRAVENOUS
  Filled 2020-01-25: qty 1

## 2020-01-25 NOTE — ED Triage Notes (Signed)
Pt's RA sats in the 70's, O2 applied 2 L/M Agency in triage.  Pt states she wears oxygen at home as needed.  sats increased to 90 % on the 2 liters.  Increased O@ to 3 L/M

## 2020-01-25 NOTE — Discharge Instructions (Addendum)
Person Under Monitoring Name: Rita Jackson  Location: [address] [place] [address]   Infection Prevention Recommendations for Individuals Confirmed to have, or Being Evaluated for, 2019 Novel Coronavirus (COVID-19) Infection Who Receive Care at Home  Individuals who are confirmed to have, or are being evaluated for, COVID-19 should follow the prevention steps below until a healthcare provider or local or state health department says they can return to normal activities.  Stay home except to get medical care You should restrict activities outside your home, except for getting medical care. Do not go to work, school, or public areas, and do not use public transportation or taxis.  Call ahead before visiting your doctor Before your medical appointment, call the healthcare provider and tell them that you have, or are being evaluated for, COVID-19 infection. This will help the healthcare provider's office take steps to keep other people from getting infected. Ask your healthcare provider to call the local or state health department.  Monitor your symptoms Seek prompt medical attention if your illness is worsening (e.g., difficulty breathing). Before going to your medical appointment, call the healthcare provider and tell them that you have, or are being evaluated for, COVID-19 infection. Ask your healthcare provider to call the local or state health department.  Wear a facemask You should wear a facemask that covers your nose and mouth when you are in the same room with other people and when you visit a healthcare provider. People who live with or visit you should also wear a facemask while they are in the same room with you.  Separate yourself from other people in your home As much as possible, you should stay in a different room from other people in your home. Also, you should use a separate bathroom, if available.  Avoid sharing household items You should not  share dishes, drinking glasses, cups, eating utensils, towels, bedding, or other items with other people in your home. After using these items, you should wash them thoroughly with soap and water.  Cover your coughs and sneezes Cover your mouth and nose with a tissue when you cough or sneeze, or you can cough or sneeze into your sleeve. Throw used tissues in a lined trash can, and immediately wash your hands with soap and water for at least 20 seconds or use an alcohol-based hand rub.  Wash your [employer] your hands often and thoroughly with soap and water for at least 20 seconds. You can use an alcohol-based hand sanitizer if soap and water are not available and if your hands are not visibly dirty. Avoid touching your eyes, nose, and mouth with unwashed hands.   Prevention Steps for Caregivers and Household Members of Individuals Confirmed to have, or Being Evaluated for, COVID-19 Infection Being Cared for in the Home  If you live with, or provide care at home for, a person confirmed to have, or being evaluated for, COVID-19 infection please follow these guidelines to prevent infection:  Follow healthcare provider's instructions Make sure that you understand and can help the patient follow any healthcare provider instructions for all care.  Provide for the patient's basic needs You should help the patient with basic needs in the home and provide support for getting groceries, prescriptions, and other personal needs.  Monitor the patient's symptoms If they are getting sicker, call his or her medical provider and tell them that the patient has, or is being evaluated for, COVID-19 infection. This will help the healthcare provider's office  take steps to keep other people from getting infected. Ask the healthcare provider to call the local or state health department.  Limit the number of people who have contact with the patient If possible, have only one caregiver for the  patient. Other household members should stay in another home or place of residence. If this is not possible, they should stay in another room, or be separated from the patient as much as possible. Use a separate bathroom, if available. Restrict visitors who do not have an essential need to be in the home.  Keep older adults, very young children, and other sick people away from the patient Keep older adults, very young children, and those who have compromised immune systems or chronic health conditions away from the patient. This includes people with chronic heart, lung, or kidney conditions, diabetes, and cancer.  Ensure good ventilation Make sure that shared spaces in the home have good air flow, such as from an air conditioner or an opened window, weather permitting.  Wash your hands often Wash your hands often and thoroughly with soap and water for at least 20 seconds. You can use an alcohol based hand sanitizer if soap and water are not available and if your hands are not visibly dirty. Avoid touching your eyes, nose, and mouth with unwashed hands. Use disposable paper towels to dry your hands. If not available, use dedicated cloth towels and replace them when they become wet.  Wear a facemask and gloves Wear a disposable facemask at all times in the room and gloves when you touch or have contact with the patient's blood, body fluids, and/or secretions or excretions, such as sweat, saliva, sputum, nasal mucus, vomit, urine, or feces.  Ensure the mask fits over your nose and mouth tightly, and do not touch it during use. Throw out disposable facemasks and gloves after using them. Do not reuse. Wash your hands immediately after removing your facemask and gloves. If your personal clothing becomes contaminated, carefully remove clothing and launder. Wash your hands after handling contaminated clothing. Place all used disposable facemasks, gloves, and other waste in a lined container before  disposing them with other household waste. Remove gloves and wash your hands immediately after handling these items.  Do not share dishes, glasses, or other household items with the patient Avoid sharing household items. You should not share dishes, drinking glasses, cups, eating utensils, towels, bedding, or other items with a patient who is confirmed to have, or being evaluated for, COVID-19 infection. After the person uses these items, you should wash them thoroughly with soap and water.  Wash laundry thoroughly Immediately remove and wash clothes or bedding that have blood, body fluids, and/or secretions or excretions, such as sweat, saliva, sputum, nasal mucus, vomit, urine, or feces, on them. Wear gloves when handling laundry from the patient. Read and follow directions on labels of laundry or clothing items and detergent. In general, wash and dry with the warmest temperatures recommended on the label.  Clean all areas the individual has used often Clean all touchable surfaces, such as counters, tabletops, doorknobs, bathroom fixtures, toilets, phones, keyboards, tablets, and bedside tables, every day. Also, clean any surfaces that may have blood, body fluids, and/or secretions or excretions on them. Wear gloves when cleaning surfaces the patient has come in contact with. Use a diluted bleach solution (e.g., dilute bleach with 1 part bleach and 10 parts water) or a household disinfectant with a label that says EPA-registered for coronaviruses. To make a bleach  solution at home, add 1 tablespoon of bleach to 1 quart (4 cups) of water. For a larger supply, add  cup of bleach to 1 gallon (16 cups) of water. Read labels of cleaning products and follow recommendations provided on product labels. Labels contain instructions for safe and effective use of the cleaning product including precautions you should take when applying the product, such as wearing gloves or eye protection and making sure you  have good ventilation during use of the product. Remove gloves and wash hands immediately after cleaning.  Monitor yourself for signs and symptoms of illness Caregivers and household members are considered close contacts, should monitor their health, and will be asked to limit movement outside of the home to the extent possible. Follow the monitoring steps for close contacts listed on the symptom monitoring form.   ? If you have additional questions, contact your local health department or call the epidemiologist on call at [phone] (available 24/7). ? This guidance is subject to change. For the most up-to-date guidance from [facility], please refer to their website: [email or web address]

## 2020-01-25 NOTE — ED Triage Notes (Signed)
Sunday night sneezing and runny nose.  Pt c/o generalized weakness and fatigued.  Denies any N/V/D.

## 2020-01-25 NOTE — ED Provider Notes (Signed)
[facility] EMERGENCY DEPARTMENT Provider Note   CSN: [id number] Arrival date & time: 01/25/20  1033     History Chief Complaint  Patient presents with  . Fatigue    Rita Jackson is a 64 y.o. female the past medical history of diabetes, chronic hypoxic respiratory failure normally on 3 L of oxygen, COPD, tobacco abuse who presents emergency department with chief complaint of fatigue.  Patient states that she has just had weakness and generalized fatigue over the past few days.  She denies cough out of her normal amount of coughing, chest pain, fever, chills, poor appetite, nausea, vomiting, diarrhea.  She has been vaccinated against coronavirus.  She denies urinary symptoms.  Apparently her oxygen saturations were in the 70s on 2 L.  She is currently at her baseline 3 L and 100%.  HPI     Past Medical History:  Diagnosis Date  . Diabetes mellitus without complication ([place])   . Hypertension   . Pneumonia   . Tobacco abuse     Patient Active Problem List   Diagnosis Date Noted  . Sepsis without acute organ dysfunction ([place])   . Leukocytosis 06/28/2018  . Acute hypoxemic respiratory failure ([place]) 06/28/2018  . Sinus tachycardia 06/28/2018  . Chain smoker 06/28/2018  . COPD (chronic obstructive pulmonary disease) ([place]) 06/28/2018  . Dehydration 08/09/2017  . Essential hypertension 08/09/2017  . Mass of upper lobe of right lung 08/09/2017  . Acute respiratory failure ([place]) 08/09/2017  . Respiratory failure with hypoxia ([place]) 08/08/2017  . Acute respiratory failure with hypoxia ([place]) 08/08/2017  . CAP (community acquired pneumonia) 06/23/2017  . Acute on chronic respiratory failure with hypoxia ([place]) 06/23/2017  . Tobacco abuse 06/23/2017    History reviewed. No pertinent surgical history.   OB History   No obstetric history on file.     Family History  Problem Relation Age of Onset  . COPD Sister     Social History   Tobacco Use  . Smoking status: Current  Every Day Smoker    Packs/day: 1.00    Types: Cigarettes  . Smokeless tobacco: Never Used  Vaping Use  . Vaping Use: Never used  Substance Use Topics  . Alcohol use: No  . Drug use: No    Home Medications Prior to Admission medications   Medication Sig Start Date End Date Taking? Authorizing Provider  amLODipine (NORVASC) 5 MG tablet Take 5 mg by mouth daily. 07/07/17   [provider]  amoxicillin-clavulanate (AUGMENTIN) 875-125 MG tablet Take 1 tablet by mouth every 12 (twelve) hours. 07/01/18   Barton Dubois, MD  blood glucose meter kit and supplies Dispense based on patient and insurance preference. Use up to four times daily as directed. (FOR ICD-10 E10.9, E11.9). 08/11/17   Amin, Jeanella Flattery, MD  chlorpheniramine-HYDROcodone (TUSSIONEX) 10-8 MG/5ML SUER Take 5 mLs by mouth every 12 (twelve) hours as needed (severe coughing spells.). 07/01/18   Barton Dubois, MD  ipratropium-albuterol (DUONEB) 0.5-2.5 (3) MG/3ML SOLN Take 3 mLs by nebulization every 4 (four) hours as needed (wheezing and shortness of breath, coughing spells). 06/26/17   Johnson, Clanford L, MD  metFORMIN (GLUCOPHAGE) 500 MG tablet Take 1 tablet (500 mg total) by mouth 2 (two) times daily with a meal. 08/11/17   Amin, Jeanella Flattery, MD  omeprazole (PRILOSEC OTC) 20 MG tablet Take 1 tablet (20 mg total) by mouth daily. 07/01/18 07/01/19  Barton Dubois, MD  OXYGEN Inhale 2 L into the lungs continuous.    [provider]  tiotropium (SPIRIVA HANDIHALER) 18 MCG inhalation capsule Place 1 capsule (18 mcg total) into inhaler and inhale daily. 07/01/18 07/01/19  Barton Dubois, MD  VIRTUSSIN A/C 100-10 MG/5ML syrup Take 10 mLs by mouth every 6 (six) hours as needed. 08/05/17   [provider]    Allergies    Patient has no known allergies.  Review of Systems   Review of Systems Ten systems reviewed and are negative for acute change, except as noted in the HPI.   Physical Exam Updated Vital Signs BP (!)  150/71   Pulse 80   Temp 100.3 F (37.9 C) (Oral)   Resp 16   Ht _0  (1.702 m)   Wt 83.9 kg   SpO2 96%   BMI 28.98 kg/m   Physical Exam Vitals and nursing note reviewed.  Constitutional:      General: She is not in acute distress.    Appearance: She is well-developed. She is not ill-appearing, toxic-appearing or diaphoretic.  HENT:     Head: Normocephalic and atraumatic.  Eyes:     General: No scleral icterus.    Conjunctiva/sclera: Conjunctivae normal.  Cardiovascular:     Rate and Rhythm: Normal rate and regular rhythm.     Heart sounds: Normal heart sounds. No murmur heard.  No friction rub. No gallop.   Pulmonary:     Effort: Pulmonary effort is normal. No respiratory distress.     Breath sounds: Normal breath sounds. No stridor. No wheezing, rhonchi or rales.  Chest:     Chest wall: No tenderness.  Abdominal:     General: Bowel sounds are normal. There is no distension.     Palpations: Abdomen is soft. There is no mass.     Tenderness: There is no abdominal tenderness. There is no guarding.  Musculoskeletal:     Cervical back: Normal range of motion.  Skin:    General: Skin is warm and dry.  Neurological:     Mental Status: She is alert and oriented to person, place, and time.  Psychiatric:        Behavior: Behavior normal.     ED Results / Procedures / Treatments   Labs (all labs ordered are listed, but only abnormal results are displayed) Labs Reviewed  BASIC METABOLIC PANEL - Abnormal; Notable for the following components:      Result Value   Glucose, Bld 116 (*)    Calcium 8.4 (*)    All other components within normal limits  CBG MONITORING, ED - Abnormal; Notable for the following components:   Glucose-Capillary 126 (*)    All other components within normal limits  SARS CORONAVIRUS 2 BY RT PCR (HOSPITAL ORDER, [place] [facility])  CBC WITH DIFFERENTIAL/PLATELET  URINALYSIS, ROUTINE W REFLEX MICROSCOPIC     EKG None  Radiology No results found.  Procedures Procedures (including critical care time)  Medications Ordered in ED Medications - No data to display  ED Course  I have reviewed the triage vital signs and the nursing notes.  Pertinent labs & imaging results that were available during my care of the patient were reviewed by me and considered in my medical decision making (see chart for details).  Clinical Course as of Jan 24 1509  Tue Jan 25, 2020  1509 Temp: 100.3 F (37.9 C) [AH]    Clinical Course User Index [AH] Margarita Mail, PA-C   MDM Rules/Calculators/A&P  64 year old female who presents to the emergency department with chief complaint of fatigue  The differential diagnosis of weakness includes but is not limited to neurologic causes (GBS, myasthenia gravis, CVA, MS, ALS, transverse myelitis, spinal cord injury, CVA, botulism, ) and other causes: ACS, Arrhythmia, syncope, orthostatic hypotension, sepsis, hypoglycemia, electrolyte disturbance, hypothyroidism, respiratory failure, symptomatic anemia, dehydration, heat injury, polypharmacy, malignancy.  I personally ordered interpreted reviewed patient's labs which include a urine which is negative for infection, CBC without abnormality, BMP shows mildly elevated blood glucose and Covid test is positive.  The patient is hemodynamically stable and maintaining oxygen above 97% on her normal 3 L Patient has received the monoclonal antibody infusion. She is doing well and is asymptomatic at this time. Will discharge with home precautions. Final Clinical Impression(s) / ED Diagnoses Final diagnoses:  None    Rx / DC Orders ED Discharge Orders    None       Margarita Mail, PA-C 01/25/20 2143    Milton Ferguson, MD 01/27/20 [phone]

## 2020-02-24 DIAGNOSIS — J449 Chronic obstructive pulmonary disease, unspecified: Secondary | ICD-10-CM | POA: Diagnosis not present

## 2020-02-24 DIAGNOSIS — J189 Pneumonia, unspecified organism: Secondary | ICD-10-CM | POA: Diagnosis not present

## 2020-03-26 DIAGNOSIS — J189 Pneumonia, unspecified organism: Secondary | ICD-10-CM | POA: Diagnosis not present

## 2020-03-26 DIAGNOSIS — J449 Chronic obstructive pulmonary disease, unspecified: Secondary | ICD-10-CM | POA: Diagnosis not present

## 2020-04-08 DIAGNOSIS — E1169 Type 2 diabetes mellitus with other specified complication: Secondary | ICD-10-CM | POA: Diagnosis not present

## 2020-04-08 DIAGNOSIS — I10 Essential (primary) hypertension: Secondary | ICD-10-CM | POA: Diagnosis not present

## 2020-04-08 DIAGNOSIS — Z9981 Dependence on supplemental oxygen: Secondary | ICD-10-CM | POA: Diagnosis not present

## 2020-04-08 DIAGNOSIS — J449 Chronic obstructive pulmonary disease, unspecified: Secondary | ICD-10-CM | POA: Diagnosis not present

## 2020-04-10 DIAGNOSIS — J449 Chronic obstructive pulmonary disease, unspecified: Secondary | ICD-10-CM | POA: Diagnosis not present

## 2020-04-10 DIAGNOSIS — E1169 Type 2 diabetes mellitus with other specified complication: Secondary | ICD-10-CM | POA: Diagnosis not present

## 2020-04-10 DIAGNOSIS — I10 Essential (primary) hypertension: Secondary | ICD-10-CM | POA: Diagnosis not present

## 2020-04-25 DIAGNOSIS — J189 Pneumonia, unspecified organism: Secondary | ICD-10-CM | POA: Diagnosis not present

## 2020-04-25 DIAGNOSIS — J449 Chronic obstructive pulmonary disease, unspecified: Secondary | ICD-10-CM | POA: Diagnosis not present

## 2020-05-26 DIAGNOSIS — J449 Chronic obstructive pulmonary disease, unspecified: Secondary | ICD-10-CM | POA: Diagnosis not present

## 2020-05-26 DIAGNOSIS — J189 Pneumonia, unspecified organism: Secondary | ICD-10-CM | POA: Diagnosis not present

## 2020-06-04 ENCOUNTER — Encounter (HOSPITAL_COMMUNITY): Payer: Self-pay

## 2020-06-04 ENCOUNTER — Other Ambulatory Visit: Payer: Self-pay

## 2020-06-04 ENCOUNTER — Emergency Department (HOSPITAL_COMMUNITY): Payer: BC Managed Care – PPO

## 2020-06-04 ENCOUNTER — Emergency Department (HOSPITAL_COMMUNITY)
Admission: EM | Admit: 2020-06-04 | Discharge: 2020-06-04 | Disposition: A | Discharge status: Home / Self Care | Payer: BC Managed Care – PPO | Attending: Emergency Medicine | Admitting: Emergency Medicine

## 2020-06-04 DIAGNOSIS — I10 Essential (primary) hypertension: Secondary | ICD-10-CM | POA: Insufficient documentation

## 2020-06-04 DIAGNOSIS — J449 Chronic obstructive pulmonary disease, unspecified: Secondary | ICD-10-CM | POA: Insufficient documentation

## 2020-06-04 DIAGNOSIS — Z79899 Other long term (current) drug therapy: Secondary | ICD-10-CM | POA: Insufficient documentation

## 2020-06-04 DIAGNOSIS — E119 Type 2 diabetes mellitus without complications: Secondary | ICD-10-CM | POA: Insufficient documentation

## 2020-06-04 DIAGNOSIS — Z87891 Personal history of nicotine dependence: Secondary | ICD-10-CM | POA: Diagnosis not present

## 2020-06-04 DIAGNOSIS — U071 COVID-19: Secondary | ICD-10-CM

## 2020-06-04 DIAGNOSIS — I517 Cardiomegaly: Secondary | ICD-10-CM | POA: Diagnosis not present

## 2020-06-04 DIAGNOSIS — R531 Weakness: Secondary | ICD-10-CM

## 2020-06-04 DIAGNOSIS — Z7984 Long term (current) use of oral hypoglycemic drugs: Secondary | ICD-10-CM | POA: Insufficient documentation

## 2020-06-04 DIAGNOSIS — R509 Fever, unspecified: Secondary | ICD-10-CM | POA: Diagnosis not present

## 2020-06-04 LAB — CBC WITH DIFFERENTIAL/PLATELET
Abs Immature Granulocytes: 0.01 10*3/uL (ref 0.00–0.07)
Basophils Absolute: 0 10*3/uL (ref 0.0–0.1)
Basophils Relative: 1 %
Eosinophils Absolute: 0 10*3/uL (ref 0.0–0.5)
Eosinophils Relative: 0 %
HCT: 45.9 % (ref 36.0–46.0)
Hemoglobin: 14 g/dL (ref 12.0–15.0)
Immature Granulocytes: 0 %
Lymphocytes Relative: 39 %
Lymphs Abs: 1.5 10*3/uL (ref 0.7–4.0)
MCH: 26.5 pg (ref 26.0–34.0)
MCHC: 30.5 g/dL (ref 30.0–36.0)
MCV: 86.8 fL (ref 80.0–100.0)
Monocytes Absolute: 0.8 10*3/uL (ref 0.1–1.0)
Monocytes Relative: 22 %
Neutro Abs: 1.4 10*3/uL — ABNORMAL LOW (ref 1.7–7.7)
Neutrophils Relative %: 38 %
Platelets: 181 10*3/uL (ref 150–400)
RBC: 5.29 MIL/uL — ABNORMAL HIGH (ref 3.87–5.11)
RDW: 16.3 % — ABNORMAL HIGH (ref 11.5–15.5)
WBC: 3.8 10*3/uL — ABNORMAL LOW (ref 4.0–10.5)
nRBC: 0 % (ref 0.0–0.2)

## 2020-06-04 LAB — URINALYSIS, ROUTINE W REFLEX MICROSCOPIC
Bilirubin Urine: NEGATIVE
Glucose, UA: NEGATIVE mg/dL
Hgb urine dipstick: NEGATIVE
Ketones, ur: 20 mg/dL — AB
Leukocytes,Ua: NEGATIVE
Nitrite: NEGATIVE
Protein, ur: 30 mg/dL — AB
Specific Gravity, Urine: 1.025 (ref 1.005–1.030)
pH: 5 (ref 5.0–8.0)

## 2020-06-04 LAB — COMPREHENSIVE METABOLIC PANEL
ALT: 12 U/L (ref 0–44)
AST: 14 U/L — ABNORMAL LOW (ref 15–41)
Albumin: 3.5 g/dL (ref 3.5–5.0)
Alkaline Phosphatase: 57 U/L (ref 38–126)
Anion gap: 9 (ref 5–15)
BUN: 19 mg/dL (ref 8–23)
CO2: 22 mmol/L (ref 22–32)
Calcium: 8.1 mg/dL — ABNORMAL LOW (ref 8.9–10.3)
Chloride: 105 mmol/L (ref 98–111)
Creatinine, Ser: 1.05 mg/dL — ABNORMAL HIGH (ref 0.44–1.00)
GFR, Estimated: >60 mL/min (ref >60)
Glucose, Bld: 110 mg/dL — ABNORMAL HIGH (ref 70–99)
Potassium: 4 mmol/L (ref 3.5–5.1)
Sodium: 136 mmol/L (ref 135–145)
Total Bilirubin: 0.6 mg/dL (ref 0.3–1.2)
Total Protein: 7.7 g/dL (ref 6.5–8.1)

## 2020-06-04 LAB — PROCALCITONIN: Procalcitonin: <0.1 ng/mL

## 2020-06-04 LAB — LACTIC ACID, PLASMA
Lactic Acid, Venous: 0.6 mmol/L (ref 0.5–1.9)
Lactic Acid, Venous: 1 mmol/L (ref 0.5–1.9)

## 2020-06-04 LAB — TROPONIN I (HIGH SENSITIVITY)
Troponin I (High Sensitivity): 5 ng/L (ref <18)
Troponin I (High Sensitivity): 5 ng/L (ref <18)

## 2020-06-04 LAB — D-DIMER, QUANTITATIVE: D-Dimer, Quant: 0.54 ug/mL-FEU — ABNORMAL HIGH (ref 0.00–0.50)

## 2020-06-04 LAB — FERRITIN: Ferritin: 106 ng/mL (ref 11–307)

## 2020-06-04 LAB — CBG MONITORING, ED: Glucose-Capillary: 103 mg/dL — ABNORMAL HIGH (ref 70–99)

## 2020-06-04 LAB — C-REACTIVE PROTEIN: CRP: 1.9 mg/dL — ABNORMAL HIGH (ref <1.0)

## 2020-06-04 LAB — FIBRINOGEN: Fibrinogen: 474 mg/dL (ref 210–475)

## 2020-06-04 LAB — LACTATE DEHYDROGENASE: LDH: 108 U/L (ref 98–192)

## 2020-06-04 LAB — TRIGLYCERIDES: Triglycerides: 70 mg/dL (ref <150)

## 2020-06-04 MED ORDER — BENZONATATE 100 MG PO CAPS: 200.0000 mg | ORAL_CAPSULE | Freq: Three times a day (TID) | ORAL | 0 refills | Status: DC | PRN

## 2020-06-04 NOTE — ED Notes (Signed)
Pt on 4L O2 via [place], pt satting high 90s on 4L

## 2020-06-04 NOTE — ED Triage Notes (Signed)
Pt presents to ED with complaints of generalized weakness and upper back pain started yesterday.

## 2020-06-04 NOTE — ED Provider Notes (Signed)
[facility] EMERGENCY DEPARTMENT Provider Note   CSN: [id number] Arrival date & time: 06/04/20  1014     History Chief Complaint  Patient presents with  . Weakness    Rita Jackson is a 65 y.o. female with a history of diabetes, hypertension, COPD on as needed home oxygen, hypertension, history of CAP, who was treated for COVID-19 pneumonia in August 2021 after being fully vaccinated, also endorses receiving her flu shot for this season presenting with a 1 day history of generalized fatigue.  She also endorses spiking a fever last night to 101 which resolved without intervention.  She currently endorses bilateral upper back soreness which started just prior to arrival.  She denies any increased shortness of breath but states she has been using her prn home oxygen 3L more chronically over the past several days.  Denies orthopnea, peripheral edema.  She denies cough, sore throat, sinus or nasal symptoms, also denies chest pain, nausea, vomiting, abdominal pain, denies dysuria.  She reports she slept well last night but woke up still feeling fatigued.  She has had no treatment prior to arrival.  Upon arrival it was noted that her oxygen saturation was 83% on room air.  Patient denies feeling short of breath, however was placed on 3 L [place] and is currently at 93%.  HPI     Past Medical History:  Diagnosis Date  . Diabetes mellitus without complication (HCC)   . Hypertension   . Pneumonia   . Tobacco abuse     Patient Active Problem List   Diagnosis Date Noted  . Sepsis without acute organ dysfunction (HCC)   . Leukocytosis 06/28/2018  . Acute hypoxemic respiratory failure (HCC) 06/28/2018  . Sinus tachycardia 06/28/2018  . Chain smoker 06/28/2018  . COPD (chronic obstructive pulmonary disease) (HCC) 06/28/2018  . Dehydration 08/09/2017  . Essential hypertension 08/09/2017  . Mass of upper lobe of right lung 08/09/2017  . Acute respiratory failure (HCC) 08/09/2017  . Respiratory  failure with hypoxia (HCC) 08/08/2017  . Acute respiratory failure with hypoxia (HCC) 08/08/2017  . CAP (community acquired pneumonia) 06/23/2017  . Acute on chronic respiratory failure with hypoxia (HCC) 06/23/2017  . Tobacco abuse 06/23/2017    History reviewed. No pertinent surgical history.   OB History   No obstetric history on file.     Family History  Problem Relation Age of Onset  . COPD Sister     Social History   Tobacco Use  . Smoking status: Former Smoker    Packs/day: 1.00    Types: Cigarettes  . Smokeless tobacco: Never Used  Vaping Use  . Vaping Use: Never used  Substance Use Topics  . Alcohol use: No  . Drug use: No    Home Medications Prior to Admission medications   Medication Sig Start Date End Date Taking? Authorizing Provider  amLODipine (NORVASC) 5 MG tablet Take 5 mg by mouth daily. 07/07/17   [provider]  atorvastatin (LIPITOR) 20 MG tablet Take 20 mg by mouth daily.    [provider]  metFORMIN (GLUCOPHAGE) 500 MG tablet Take 1 tablet (500 mg total) by mouth 2 (two) times daily with a meal. Patient taking differently: Take 1,000 mg by mouth at bedtime.  08/11/17   Amin, Loura Halt, MD  OXYGEN Inhale 2-3 L into the lungs daily as needed (for shortness of breath).     [provider]    Allergies    Patient has no known allergies.  Review  of Systems   Review of Systems  Constitutional: Positive for fatigue and fever.  HENT: Negative for congestion, sinus pressure, sinus pain and sore throat.   Eyes: Negative.   Respiratory: Negative for chest tightness and shortness of breath.   Cardiovascular: Negative for chest pain, palpitations and leg swelling.  Gastrointestinal: Negative for abdominal pain, diarrhea, nausea and vomiting.  Genitourinary: Negative.  Negative for dysuria.  Musculoskeletal: Positive for back pain. Negative for arthralgias, joint swelling and neck pain.  Skin: Negative.  Negative for rash  and wound.  Neurological: Positive for weakness. Negative for dizziness, light-headedness, numbness and headaches.  Psychiatric/Behavioral: Negative.   All other systems reviewed and are negative.   Physical Exam Updated Vital Signs BP 119/65 (BP Location: Right Arm)   Pulse 75   Temp 99.2 F (37.3 C) (Oral)   Resp 18   Ht 5\' 7"  (1.702 m)   Wt 81.6 kg   SpO2 96%   BMI 28.19 kg/m   Physical Exam Vitals and nursing note reviewed.  Constitutional:      Appearance: She is well-developed and well-nourished. She is not toxic-appearing.     Comments: Appears fatigued, but no distress.   HENT:     Head: Normocephalic and atraumatic.     Mouth/Throat:     Mouth: Mucous membranes are moist.  Eyes:     Conjunctiva/sclera: Conjunctivae normal.  Cardiovascular:     Rate and Rhythm: Normal rate and regular rhythm.     Pulses: Intact distal pulses.     Heart sounds: Normal heart sounds.  Pulmonary:     Effort: Pulmonary effort is normal.     Breath sounds: Normal breath sounds. No wheezing or rhonchi.     Comments: Reports comfortable with breathing, 3L, 93% Abdominal:     General: Bowel sounds are normal.     Palpations: Abdomen is soft.     Tenderness: There is no abdominal tenderness. There is no guarding.  Musculoskeletal:        General: Normal range of motion.     Cervical back: Normal range of motion.     Right lower leg: No edema.     Left lower leg: No edema.  Skin:    General: Skin is warm and dry.  Neurological:     Mental Status: She is alert.  Psychiatric:        Mood and Affect: Mood and affect normal.     ED Results / Procedures / Treatments   Labs (all labs ordered are listed, but only abnormal results are displayed) Labs Reviewed  COMPREHENSIVE METABOLIC PANEL - Abnormal; Notable for the following components:      Result Value   Glucose, Bld 110 (*)    Creatinine, Ser 1.05 (*)    Calcium 8.1 (*)    AST 14 (*)    All other components within normal  limits  CBC WITH DIFFERENTIAL/PLATELET - Abnormal; Notable for the following components:   WBC 3.8 (*)    RBC 5.29 (*)    RDW 16.3 (*)    Neutro Abs 1.4 (*)    All other components within normal limits  URINALYSIS, ROUTINE W REFLEX MICROSCOPIC - Abnormal; Notable for the following components:   Color, Urine AMBER (*)    APPearance CLOUDY (*)    Ketones, ur 20 (*)    Protein, ur 30 (*)    Bacteria, UA FEW (*)    All other components within normal limits  D-DIMER, QUANTITATIVE (NOT AT [facility]) -  Abnormal; Notable for the following components:   D-Dimer, Quant 0.54 (*)    All other components within normal limits  C-REACTIVE PROTEIN - Abnormal; Notable for the following components:   CRP 1.9 (*)    All other components within normal limits  CBG MONITORING, ED - Abnormal; Notable for the following components:   Glucose-Capillary 103 (*)    All other components within normal limits  CULTURE, BLOOD (ROUTINE X 2)  CULTURE, BLOOD (ROUTINE X 2)  LACTIC ACID, PLASMA  LACTIC ACID, PLASMA  PROCALCITONIN  LACTATE DEHYDROGENASE  FERRITIN  TRIGLYCERIDES  FIBRINOGEN  POC SARS CORONAVIRUS 2 AG -  ED  TROPONIN I (HIGH SENSITIVITY)  TROPONIN I (HIGH SENSITIVITY)    EKG None ED ECG REPORT   Date: 06/04/2020  Rate: 79  Rhythm: normal sinus rhythm  QRS Axis: normal  Intervals: normal  ST/T Wave abnormalities: normal  Conduction Disutrbances:none  Narrative Interpretation:   Old EKG Reviewed: unchanged  I have personally reviewed the EKG tracing and agree with the computerized printout as noted.  Radiology DG Chest Port 1 View  Result Date: 06/04/2020 CLINICAL DATA:  Fever and weakness. EXAM: PORTABLE CHEST 1 VIEW COMPARISON:  Chest x-rays dated 01/25/2020 and 07/30/2011. FINDINGS: Stable cardiomegaly. Coarse lung markings bilaterally suggesting some degree of chronic interstitial lung disease. Lungs are otherwise clear. No pleural effusion or pneumothorax is seen. Osseous structures  about the chest are unremarkable. IMPRESSION: 1. No active disease. No evidence of pneumonia or pulmonary edema. 2. Stable cardiomegaly. Electronically Signed   By: Bary Richard M.D.   On: 06/04/2020 11:22    Procedures Procedures (including critical care time)  Medications Ordered in ED Medications - No data to display  ED Course  I have reviewed the triage vital signs and the nursing notes.  Pertinent labs & imaging results that were available during my care of the patient were reviewed by me and considered in my medical decision making (see chart for details).    MDM Rules/Calculators/A&P                          Pt has tested positive for Covid 19 with no pneumonia, no sob and maintains o2 saturation above 97% with ambulation on her home oxygen level lf 3L.  Other labs, imaging reviewed.  Pt denies any complaint at this time except for fatigue.  She has also noticed a dry cough since arriving here.  Will prescribe tessalon.  Discussed need for home quarantine per cdc recommendations.  Also discussed MAB tx and referral given to determine eligibility.  Strict return precautions outlined.    Rita Jackson was evaluated in Emergency Department on 06/04/2020 for the symptoms described in the history of present illness. She was evaluated in the context of the global COVID-19 pandemic, which necessitated consideration that the patient might be at risk for infection with the SARS-CoV-2 virus that causes COVID-19. Institutional protocols and algorithms that pertain to the evaluation of patients at risk for COVID-19 are in a state of rapid change based on information released by regulatory bodies including the CDC and federal and state organizations. These policies and algorithms were followed during the patient's care in the ED.  Final Clinical Impression(s) / ED Diagnoses Final diagnoses:  COVID-19  Generalized weakness    Rx / DC Orders ED Discharge Orders    None       Victoriano Lain 06/04/20 1445    Jacalyn Lefevre, MD  06/06/20 0104  

## 2020-06-04 NOTE — ED Notes (Signed)
Walked pt while watching oxygen 3x back and forth to door to bed, pt oxygen stayed 98-100.  Pt stated she was not dizzy or lightheaded!

## 2020-06-04 NOTE — Discharge Instructions (Signed)
Rest and make sure you are hydrating.  Continue using your home oxygen if needed for any shortness of breath.  Get rechecked immediately for any worsening symptoms, especially shortness of breath or increased weakness, dizziness.  You will need to maintain a home quarantine for 5 days, after which you can return to normal activity if your symptoms are resolved.   I have referred you to the monoclonal antibody clinic for possible infusion treatment for this - they will determine if you are eligible and call you to arrange if so.

## 2020-06-05 ENCOUNTER — Telehealth (HOSPITAL_COMMUNITY): Payer: Self-pay

## 2020-06-05 ENCOUNTER — Telehealth: Payer: Self-pay | Admitting: Unknown Physician Specialty

## 2020-06-05 MED ORDER — NIRMATRELVIR/RITONAVIR (PAXLOVID)TABLET: 3.0000 | ORAL_TABLET | Freq: Two times a day (BID) | ORAL | 0 refills | Status: DC

## 2020-06-05 NOTE — Telephone Encounter (Signed)
Patient was prescribed oral covid treatment paxlovid and treatment note was reviewed. Medication has been received by [name] and reviewed for appropriateness.  Drug Interactions or Dosage Adjustments Noted: counseled to hold atorvastatin for duration of paxlovid therapy, creatinine clearance calculated at 68ml per min so no adjustment needed  Delivery Method:pick up   Patient contacted for counseling on 06/05/20 and verbalized understanding.   Delivery or Pick-Up Date:06/05/20  [name] 06/05/2020, 4:48 PM [facility] Pharmacist Phone# [phone]

## 2020-06-05 NOTE — Telephone Encounter (Signed)
Outpatient Oral COVID Treatment Note  I connected with Rita Jackson on 06/05/2020/4:05 PM by telephone and verified that I am speaking with the correct person using two identifiers.  I discussed the limitations, risks, security, and privacy concerns of performing an evaluation and management service by telephone and the availability of in person appointments. I also discussed with the patient that there may be a patient responsible charge related to this service. The patient expressed understanding and agreed to proceed.  Patient location: home Provider location: home  Diagnosis: COVID-19 infection  Purpose of visit: Discussion of potential use of Molnupiravir or Paxlovid, a new treatment for mild to moderate COVID-19 viral infection in non-hospitalized patients.   Subjective: Patient is a 65 y.o. female who has been diagnosed with COVID 19 viral infection.  Their symptoms began on 06/03/2020 with fever and fatigue.    Past Medical History:  Diagnosis Date  . Diabetes mellitus without complication (HCC)   . Hypertension   . Pneumonia   . Tobacco abuse     No Known Allergies   Current Outpatient Medications:  .  amLODipine (NORVASC) 5 MG tablet, Take 5 mg by mouth daily., Disp: , Rfl: 1 .  atorvastatin (LIPITOR) 20 MG tablet, Take 20 mg by mouth daily., Disp: , Rfl:  .  benzonatate (TESSALON) 100 MG capsule, Take 2 capsules (200 mg total) by mouth 3 (three) times daily as needed., Disp: 30 capsule, Rfl: 0 .  metFORMIN (GLUCOPHAGE) 500 MG tablet, Take 1 tablet (500 mg total) by mouth 2 (two) times daily with a meal. (Patient taking differently: Take 1,000 mg by mouth at bedtime. ), Disp: 60 tablet, Rfl: 0 .  OXYGEN, Inhale 2-3 L into the lungs daily as needed (for shortness of breath). , Disp: , Rfl:   Objective: Patient appears/sounds ill but in no distress.  They are in no apparent distress.  Breathing is non labored.  Mood and behavior are normal.  Laboratory Data:  Recent  Results (from the past 2160 hour(s))  CBG monitoring, ED     Status: Abnormal   Collection Time: 06/04/20 11:22 AM  Result Value Ref Range   Glucose-Capillary 103 (H) 70 - 99 mg/dL    Comment: Glucose reference range applies only to samples taken after fasting for at least 8 hours.  Comprehensive metabolic panel     Status: Abnormal   Collection Time: 06/04/20 11:30 AM  Result Value Ref Range   Sodium 136 135 - 145 mmol/L   Potassium 4.0 3.5 - 5.1 mmol/L   Chloride 105 98 - 111 mmol/L   CO2 22 22 - 32 mmol/L   Glucose, Bld 110 (H) 70 - 99 mg/dL    Comment: Glucose reference range applies only to samples taken after fasting for at least 8 hours.   BUN 19 8 - 23 mg/dL   Creatinine, Ser 1.611.05 (H) 0.44 - 1.00 mg/dL   Calcium 8.1 (L) 8.9 - 10.3 mg/dL   Total Protein 7.7 6.5 - 8.1 g/dL   Albumin 3.5 3.5 - 5.0 g/dL   AST 14 (L) 15 - 41 U/L   ALT 12 0 - 44 U/L   Alkaline Phosphatase 57 38 - 126 U/L   Total Bilirubin 0.6 0.3 - 1.2 mg/dL   GFR, Estimated >09>60 >60>60 mL/min    Comment: (NOTE) Calculated using the CKD-EPI Creatinine Equation (2021)    Anion gap 9 5 - 15    Comment: Performed at [facility], [address] [place], [place]  [address]  Troponin I (High Sensitivity)     Status: None   Collection Time: 06/04/20 11:30 AM  Result Value Ref Range   Troponin I (High Sensitivity) 5 <18 ng/L    Comment: (NOTE) Elevated high sensitivity troponin I (hsTnI) values and significant  changes across serial measurements may suggest ACS but many other  chronic and acute conditions are known to elevate hsTnI results.  Refer to the "Links" section for chest pain algorithms and additional  guidance. Performed at [facility], [address], [place], [place] [address]   CBC with Differential     Status: Abnormal   Collection Time: 06/04/20 11:30 AM  Result Value Ref Range   WBC 3.8 (L) 4.0 - 10.5 K/uL   RBC 5.29 (H) 3.87 - 5.11 MIL/uL   Hemoglobin 14.0 12.0 - 15.0 g/dL   HCT 70.3 50.0 - 93.8  %   MCV 86.8 80.0 - 100.0 fL   MCH 26.5 26.0 - 34.0 pg   MCHC 30.5 30.0 - 36.0 g/dL   RDW 18.2 (H) 99.3 - 71.6 %   Platelets 181 150 - 400 K/uL   nRBC 0.0 0.0 - 0.2 %   Neutrophils Relative % 38 %   Neutro Abs 1.4 (L) 1.7 - 7.7 K/uL   Lymphocytes Relative 39 %   Lymphs Abs 1.5 0.7 - 4.0 K/uL   Monocytes Relative 22 %   Monocytes Absolute 0.8 0.1 - 1.0 K/uL   Eosinophils Relative 0 %   Eosinophils Absolute 0.0 0.0 - 0.5 K/uL   Basophils Relative 1 %   Basophils Absolute 0.0 0.0 - 0.1 K/uL   Immature Granulocytes 0 %   Abs Immature Granulocytes 0.01 0.00 - 0.07 K/uL    Comment: Performed at [facility], [address] [place], [place] [address]  Urinalysis, Routine w reflex microscopic Urine, Clean Catch     Status: Abnormal   Collection Time: 06/04/20 11:30 AM  Result Value Ref Range   Color, Urine AMBER (A) YELLOW    Comment: BIOCHEMICALS MAY BE AFFECTED BY COLOR   APPearance CLOUDY (A) CLEAR   Specific Gravity, Urine 1.025 1.005 - 1.030   pH 5.0 5.0 - 8.0   Glucose, UA NEGATIVE NEGATIVE mg/dL   Hgb urine dipstick NEGATIVE NEGATIVE   Bilirubin Urine NEGATIVE NEGATIVE   Ketones, ur 20 (A) NEGATIVE mg/dL   Protein, ur 30 (A) NEGATIVE mg/dL   Nitrite NEGATIVE NEGATIVE   Leukocytes,Ua NEGATIVE NEGATIVE   RBC / HPF 0-5 0 - 5 RBC/hpf   WBC, UA 6-10 0 - 5 WBC/hpf   Bacteria, UA FEW (A) NONE SEEN   Squamous Epithelial / LPF 11-20 0 - 5   Mucus PRESENT     Comment: Performed at [facility], [address] [place], [place] [address]  Lactic acid, plasma     Status: None   Collection Time: 06/04/20 11:30 AM  Result Value Ref Range   Lactic Acid, Venous 0.6 0.5 - 1.9 mmol/L    Comment: Performed at [facility], [address], [place], [place] [address]  Blood culture (routine x 2)     Status: None (Preliminary result)   Collection Time: 06/04/20 11:30 AM   Specimen: BLOOD  Result Value Ref Range   Specimen Description      BLOOD LEFT ANTECUBITAL Performed at [facility], [address], [place], [place] [address]    Special Requests      BOTTLES DRAWN AEROBIC AND ANAEROBIC Blood Culture adequate  volume Performed at [facility], [address], [place], [place] [address]    Culture      NO GROWTH 1 DAY Performed at [facility], [address], [place], [place] [address]    Report Status PENDING   D-dimer, quantitative     Status: Abnormal   Collection Time: 06/04/20 11:30 AM  Result Value Ref Range   D-Dimer, Quant 0.54 (H) 0.00 - 0.50 ug/mL-FEU    Comment: (NOTE) At the manufacturer cut-off value of 0.5 g/mL FEU, this assay has a negative predictive value of 95-100%.This assay is intended for use in conjunction with a clinical pretest probability (PTP) assessment model to exclude pulmonary embolism (PE) and deep venous thrombosis (DVT) in outpatients suspected of PE or DVT. Results should be correlated with clinical presentation. Performed at [facility], [address], [place], [place] [address]   Procalcitonin     Status: None   Collection Time: 06/04/20 11:30 AM  Result Value Ref Range   Procalcitonin <0.10 ng/mL    Comment:        Interpretation: PCT (Procalcitonin) <= 0.5 ng/mL: Systemic infection (sepsis) is not likely. Local bacterial infection is possible. (NOTE)       Sepsis PCT Algorithm           Lower Respiratory Tract                                      Infection PCT Algorithm    ----------------------------     ----------------------------         PCT < 0.25 ng/mL                PCT < 0.10 ng/mL          Strongly encourage             Strongly discourage   discontinuation of antibiotics    initiation of antibiotics    ----------------------------     -----------------------------       PCT 0.25 - 0.50 ng/mL            PCT 0.10 - 0.25 ng/mL               OR       >80% decrease in PCT            Discourage initiation of                                             antibiotics      Encourage discontinuation           of antibiotics    ----------------------------     -----------------------------         PCT >= 0.50 ng/mL              PCT 0.26 - 0.50 ng/mL               AND        <80% decrease in PCT             Encourage initiation of  antibiotics       Encourage continuation           of antibiotics    ----------------------------     -----------------------------        PCT >= 0.50 ng/mL                  PCT > 0.50 ng/mL               AND         increase in PCT                  Strongly encourage                                      initiation of antibiotics    Strongly encourage escalation           of antibiotics                                     -----------------------------                                           PCT <= 0.25 ng/mL                                                 OR                                        > 80% decrease in PCT                                      Discontinue / Do not initiate                                             antibiotics  Performed at [facility], [address] [place], [place] [address]   Lactate dehydrogenase     Status: None   Collection Time: 06/04/20 11:30 AM  Result Value Ref Range   LDH 108 98 - 192 U/L    Comment: Performed at [facility], [address] [place], [place] [address]  Ferritin     Status: None   Collection Time: 06/04/20 11:30 AM  Result Value Ref Range   Ferritin 106 11 - 307 ng/mL    Comment: Performed at [facility], [address] [place], [place] [address]  Triglycerides     Status: None   Collection Time: 06/04/20 11:30 AM  Result Value Ref Range   Triglycerides 70 <150 mg/dL    Comment: Performed at [facility], [address] [place], [place] [address]  Fibrinogen     Status: None   Collection Time: 06/04/20 11:30 AM  Result Value Ref Range   Fibrinogen 474 210 - 475 mg/dL  Comment:  Performed at [facility], [address] [place], [place] [address]  C-reactive protein     Status: Abnormal   Collection Time: 06/04/20 11:30 AM  Result Value Ref Range   CRP 1.9 (H) <1.0 mg/dL    Comment: Performed at [facility], [address], [place], [place] [address]  Blood culture (routine x 2)     Status: None (Preliminary result)   Collection Time: 06/04/20 11:36 AM   Specimen: BLOOD  Result Value Ref Range   Specimen Description      BLOOD BLOOD RIGHT HAND Performed at [facility], [address] [place], [place] [address]    Special Requests      BOTTLES DRAWN AEROBIC AND ANAEROBIC Blood Culture adequate volume Performed at [facility], [address], [place], [place] [address]    Culture      NO GROWTH 1 DAY Performed at [facility], [address], [place], [place] [address]    Report Status PENDING   Troponin I (High Sensitivity)     Status: None   Collection Time: 06/04/20 12:54 PM  Result Value Ref Range   Troponin I (High Sensitivity) 5 <18 ng/L    Comment: (NOTE) Elevated high sensitivity troponin I (hsTnI) values and significant  changes across serial measurements may suggest ACS but many other  chronic and acute conditions are known to elevate hsTnI results.  Refer to the "Links" section for chest pain algorithms and additional  guidance. Performed at [facility], [address] [place], [place] [address]   Lactic acid, plasma     Status: None   Collection Time: 06/04/20  1:30 PM  Result Value Ref Range   Lactic Acid, Venous 1.0 0.5 - 1.9 mmol/L    Comment: Performed at [facility], [address] [place], [place] [address]     Assessment: 64 y.o. female with mild/moderate COVID 19 viral infection diagnosed on 06/04/20 at high risk for progression to severe COVID 19.  Plan:  This patient is a 65 y.o. female that meets the following criteria for Emergency Use  Authorization of: Paxlovid 1. Age >12 yr AND > 40 kg 2. SARS-COV-2 positive test 3. Symptom onset < 5 days 4. Mild-to-moderate COVID disease with high risk for severe progression to hospitalization or death  I have spoken and communicated the following to the patient or parent/caregiver regarding: 1. Paxlovid is an unapproved drug that is authorized for use under an Emergency Use Authorization.  2. There are no adequate, approved, available products for the treatment of COVID-19 in adults who have mild-to-moderate COVID-19 and are at high risk for progressing to severe COVID-19, including hospitalization or death. 3. Other therapeutics are currently authorized. For additional information on all products authorized for treatment or prevention of COVID-19, please see [email or web address]-[email or web address]  4. There are benefits and risks of taking this treatment as outlined in the "Fact Sheet for Patients and Caregivers."  5. "Fact Sheet for Patients and Caregivers" was reviewed with patient. A hard copy will be provided to patient from pharmacy prior to the patient receiving treatment. 6. Patients should continue to self-isolate and use infection control measures (e.g., wear mask, isolate, social distance, avoid sharing personal items, clean and disinfect "high touch" surfaces, and frequent handwashing) according to CDC guidelines.  7. The patient or parent/caregiver has the option to accept or refuse treatment. 8. Patient medication history was reviewed for potential drug interactions:Interaction with home meds: Atorvastatin and  Amlodipine 9. Patient's creatinine clearance was calculated to be 72, and they were therefore prescribed Normal dose (CrCl>60) - nirmatrelvir 150mg  tab (2 tablet) by mouth twice daily AND ritonavir 100mg  tab (1 tablet) by mouth twice daily   After reviewing above information with the  patient, the patient agrees to receive Paxlovid.  Follow up instructions:    . Take prescription BID x 5 days as directed . Reach out to pharmacist for counseling on medication if desired . For concerns regarding further COVID symptoms please follow up with your PCP or urgent care . For urgent or life-threatening issues, seek care at your local emergency department  The patient was provided an opportunity to ask questions, and all were answered. The patient agreed with the plan and demonstrated an understanding of the instructions.   Script sent to [facility] and opted to pick up RX.  The patient was advised to call their PCP or seek an in-person evaluation if the symptoms worsen or if the condition fails to improve as anticipated.   I provided 20 minutes of non face-to-face telephone visit time during this encounter, and > 50% was spent counseling as documented under my assessment & plan.  , NP 06/05/2020 /4:05 PM  Pt will stop Atorvastatin while she is taking this medication.

## 2020-06-07 ENCOUNTER — Telehealth (HOSPITAL_COMMUNITY): Payer: Self-pay | Admitting: Pharmacist

## 2020-06-07 ENCOUNTER — Telehealth: Payer: Self-pay | Admitting: Family

## 2020-06-07 NOTE — Telephone Encounter (Signed)
I connected by phone with Rita Jackson on 06/07/2020 at 9:37 AM to discuss Paxlovid prescription which was sent to the pharmacy for her on 06/05/2020 for treatment of COVID-19. Her symptom onset was 06/03/2020 and as such would need to start treatment today to be within 5 day window.   She tells me at this time she has changed her mind and no longer wishes to take Paxlovid. Tells me her symptoms have continued to improve.   Will make [facility] pharmacy team aware.   I have removed Paxlovid from her medication list.   Alver Sorrow, NP

## 2020-06-07 NOTE — Telephone Encounter (Signed)
Patient was prescribed oral covid treatment paxlovid and treatment note was reviewed. Medication has been received by [facility] and reviewed for appropriateness.  Drug Interactions or Dosage Adjustments Noted: N/A  Delivery Method: Patient has decided to not take the medication.  Provider is aware.    Larrie Kass 06/07/2020, 10:39 AM [facility] Pharmacist Phone# [phone]

## 2020-06-09 LAB — CULTURE, BLOOD (ROUTINE X 2)
Culture: NO GROWTH
Culture: NO GROWTH
Special Requests: ADEQUATE
Special Requests: ADEQUATE

## 2020-06-26 DIAGNOSIS — J189 Pneumonia, unspecified organism: Secondary | ICD-10-CM | POA: Diagnosis not present

## 2020-06-26 DIAGNOSIS — J449 Chronic obstructive pulmonary disease, unspecified: Secondary | ICD-10-CM | POA: Diagnosis not present

## 2020-07-22 DIAGNOSIS — B353 Tinea pedis: Secondary | ICD-10-CM | POA: Diagnosis not present

## 2020-07-22 DIAGNOSIS — E1169 Type 2 diabetes mellitus with other specified complication: Secondary | ICD-10-CM | POA: Diagnosis not present

## 2020-07-22 DIAGNOSIS — I10 Essential (primary) hypertension: Secondary | ICD-10-CM | POA: Diagnosis not present

## 2020-07-22 DIAGNOSIS — E785 Hyperlipidemia, unspecified: Secondary | ICD-10-CM | POA: Diagnosis not present

## 2020-07-22 DIAGNOSIS — Z9981 Dependence on supplemental oxygen: Secondary | ICD-10-CM | POA: Diagnosis not present

## 2020-07-22 DIAGNOSIS — J449 Chronic obstructive pulmonary disease, unspecified: Secondary | ICD-10-CM | POA: Diagnosis not present

## 2020-07-24 DIAGNOSIS — J189 Pneumonia, unspecified organism: Secondary | ICD-10-CM | POA: Diagnosis not present

## 2020-07-24 DIAGNOSIS — J449 Chronic obstructive pulmonary disease, unspecified: Secondary | ICD-10-CM | POA: Diagnosis not present

## 2020-08-24 DIAGNOSIS — J189 Pneumonia, unspecified organism: Secondary | ICD-10-CM | POA: Diagnosis not present

## 2020-08-24 DIAGNOSIS — J449 Chronic obstructive pulmonary disease, unspecified: Secondary | ICD-10-CM | POA: Diagnosis not present

## 2020-09-23 DIAGNOSIS — J449 Chronic obstructive pulmonary disease, unspecified: Secondary | ICD-10-CM | POA: Diagnosis not present

## 2020-09-23 DIAGNOSIS — J189 Pneumonia, unspecified organism: Secondary | ICD-10-CM | POA: Diagnosis not present

## 2020-10-24 DIAGNOSIS — J449 Chronic obstructive pulmonary disease, unspecified: Secondary | ICD-10-CM | POA: Diagnosis not present

## 2020-10-24 DIAGNOSIS — J189 Pneumonia, unspecified organism: Secondary | ICD-10-CM | POA: Diagnosis not present

## 2020-10-30 ENCOUNTER — Emergency Department (HOSPITAL_COMMUNITY): Payer: BC Managed Care – PPO

## 2020-10-30 ENCOUNTER — Other Ambulatory Visit: Payer: Self-pay

## 2020-10-30 ENCOUNTER — Encounter (HOSPITAL_COMMUNITY): Payer: Self-pay | Admitting: Emergency Medicine

## 2020-10-30 ENCOUNTER — Emergency Department (HOSPITAL_COMMUNITY)
Admission: EM | Admit: 2020-10-30 | Discharge: 2020-10-30 | Disposition: A | Discharge status: Home / Self Care | Payer: BC Managed Care – PPO | Attending: Emergency Medicine | Admitting: Emergency Medicine

## 2020-10-30 DIAGNOSIS — J439 Emphysema, unspecified: Secondary | ICD-10-CM | POA: Diagnosis not present

## 2020-10-30 DIAGNOSIS — Z7984 Long term (current) use of oral hypoglycemic drugs: Secondary | ICD-10-CM | POA: Diagnosis not present

## 2020-10-30 DIAGNOSIS — R531 Weakness: Secondary | ICD-10-CM | POA: Diagnosis not present

## 2020-10-30 DIAGNOSIS — E119 Type 2 diabetes mellitus without complications: Secondary | ICD-10-CM | POA: Diagnosis not present

## 2020-10-30 DIAGNOSIS — I10 Essential (primary) hypertension: Secondary | ICD-10-CM | POA: Insufficient documentation

## 2020-10-30 DIAGNOSIS — D72829 Elevated white blood cell count, unspecified: Secondary | ICD-10-CM | POA: Insufficient documentation

## 2020-10-30 DIAGNOSIS — Z87891 Personal history of nicotine dependence: Secondary | ICD-10-CM | POA: Diagnosis not present

## 2020-10-30 DIAGNOSIS — R059 Cough, unspecified: Secondary | ICD-10-CM | POA: Diagnosis not present

## 2020-10-30 DIAGNOSIS — J449 Chronic obstructive pulmonary disease, unspecified: Secondary | ICD-10-CM | POA: Diagnosis not present

## 2020-10-30 DIAGNOSIS — Z79899 Other long term (current) drug therapy: Secondary | ICD-10-CM | POA: Insufficient documentation

## 2020-10-30 DIAGNOSIS — Z20822 Contact with and (suspected) exposure to covid-19: Secondary | ICD-10-CM | POA: Insufficient documentation

## 2020-10-30 DIAGNOSIS — E871 Hypo-osmolality and hyponatremia: Secondary | ICD-10-CM | POA: Diagnosis not present

## 2020-10-30 DIAGNOSIS — J069 Acute upper respiratory infection, unspecified: Secondary | ICD-10-CM | POA: Diagnosis not present

## 2020-10-30 DIAGNOSIS — I517 Cardiomegaly: Secondary | ICD-10-CM | POA: Diagnosis not present

## 2020-10-30 DIAGNOSIS — B9789 Other viral agents as the cause of diseases classified elsewhere: Secondary | ICD-10-CM | POA: Diagnosis not present

## 2020-10-30 HISTORY — DX: Chronic obstructive pulmonary disease, unspecified: J44.9

## 2020-10-30 LAB — RESP PANEL BY RT-PCR (FLU A&B, COVID) ARPGX2
Influenza A by PCR: NEGATIVE
Influenza B by PCR: NEGATIVE
SARS Coronavirus 2 by RT PCR: NEGATIVE

## 2020-10-30 LAB — COMPREHENSIVE METABOLIC PANEL
ALT: 12 U/L (ref 0–44)
AST: 11 U/L — ABNORMAL LOW (ref 15–41)
Albumin: 3.7 g/dL (ref 3.5–5.0)
Alkaline Phosphatase: 78 U/L (ref 38–126)
Anion gap: 6 (ref 5–15)
BUN: 14 mg/dL (ref 8–23)
CO2: 27 mmol/L (ref 22–32)
Calcium: 8.5 mg/dL — ABNORMAL LOW (ref 8.9–10.3)
Chloride: 101 mmol/L (ref 98–111)
Creatinine, Ser: 0.99 mg/dL (ref 0.44–1.00)
GFR, Estimated: >60 mL/min (ref >60)
Glucose, Bld: 96 mg/dL (ref 70–99)
Potassium: 4.1 mmol/L (ref 3.5–5.1)
Sodium: 134 mmol/L — ABNORMAL LOW (ref 135–145)
Total Bilirubin: 0.8 mg/dL (ref 0.3–1.2)
Total Protein: 7.9 g/dL (ref 6.5–8.1)

## 2020-10-30 LAB — LACTIC ACID, PLASMA: Lactic Acid, Venous: 1.4 mmol/L (ref 0.5–1.9)

## 2020-10-30 LAB — CBC WITH DIFFERENTIAL/PLATELET
Abs Immature Granulocytes: 0.11 10*3/uL — ABNORMAL HIGH (ref 0.00–0.07)
Basophils Absolute: 0 10*3/uL (ref 0.0–0.1)
Basophils Relative: 0 %
Eosinophils Absolute: 0 10*3/uL (ref 0.0–0.5)
Eosinophils Relative: 0 %
HCT: 47.7 % — ABNORMAL HIGH (ref 36.0–46.0)
Hemoglobin: 14.6 g/dL (ref 12.0–15.0)
Immature Granulocytes: 1 %
Lymphocytes Relative: 11 %
Lymphs Abs: 1.6 10*3/uL (ref 0.7–4.0)
MCH: 27.2 pg (ref 26.0–34.0)
MCHC: 30.6 g/dL (ref 30.0–36.0)
MCV: 89 fL (ref 80.0–100.0)
Monocytes Absolute: 1.3 10*3/uL — ABNORMAL HIGH (ref 0.1–1.0)
Monocytes Relative: 9 %
Neutro Abs: 11.5 10*3/uL — ABNORMAL HIGH (ref 1.7–7.7)
Neutrophils Relative %: 79 %
Platelets: 204 10*3/uL (ref 150–400)
RBC: 5.36 MIL/uL — ABNORMAL HIGH (ref 3.87–5.11)
RDW: 15.5 % (ref 11.5–15.5)
WBC: 14.6 10*3/uL — ABNORMAL HIGH (ref 4.0–10.5)
nRBC: 0 % (ref 0.0–0.2)

## 2020-10-30 NOTE — ED Notes (Signed)
Pt was placed on 3L [place]

## 2020-10-30 NOTE — ED Notes (Signed)
Pt's o2 sat increased to 90% on 3 liters.  Pt says wears 3 liters of o2 at home prn.

## 2020-10-30 NOTE — ED Triage Notes (Signed)
Pt states she feels weak, has a cough and congestion, with a sore throat that began yesterday.  Pt denies known covid exposure and has not been tested.

## 2020-10-30 NOTE — ED Provider Notes (Signed)
[facility] EMERGENCY DEPARTMENT Provider Note   CSN: [id number] Arrival date & time: 10/30/20  1131     History Chief Complaint  Patient presents with  . Cough    Rita Jackson is a 65 y.o. female.  HPI 65 year old female with history of COPD, DM type II, hypertension, pneumonia presents to the ER with complaints of feeling weak, with a nonproductive cough and nasal congestion with sore throat that began yesterday.  No known COVID exposures.  She is vaccinated and boosted for COVID-19.  Denies any nausea or vomiting.  No chest pain.  Denies any fevers or chills.  Was noted to be hypoxic in triage, states that she wears oxygen at home when she "feels like she needs it".  Denies any leg swelling, no recent travel.  No prior history of PE.  Denies any dysuria, hematuria.    Past Medical History:  Diagnosis Date  . COPD (chronic obstructive pulmonary disease) (HCC)   . Diabetes mellitus without complication (HCC)   . Hypertension   . Pneumonia   . Tobacco abuse     Patient Active Problem List   Diagnosis Date Noted  . Sepsis without acute organ dysfunction (HCC)   . Leukocytosis 06/28/2018  . Acute hypoxemic respiratory failure (HCC) 06/28/2018  . Sinus tachycardia 06/28/2018  . Chain smoker 06/28/2018  . COPD (chronic obstructive pulmonary disease) (HCC) 06/28/2018  . Dehydration 08/09/2017  . Essential hypertension 08/09/2017  . Mass of upper lobe of right lung 08/09/2017  . Acute respiratory failure (HCC) 08/09/2017  . Respiratory failure with hypoxia (HCC) 08/08/2017  . Acute respiratory failure with hypoxia (HCC) 08/08/2017  . CAP (community acquired pneumonia) 06/23/2017  . Acute on chronic respiratory failure with hypoxia (HCC) 06/23/2017  . Tobacco abuse 06/23/2017    History reviewed. No pertinent surgical history.   OB History   No obstetric history on file.     Family History  Problem Relation Age of Onset  . COPD Sister     Social History    Tobacco Use  . Smoking status: Former Smoker    Packs/day: 1.00    Types: Cigarettes  . Smokeless tobacco: Never Used  Vaping Use  . Vaping Use: Never used  Substance Use Topics  . Alcohol use: No  . Drug use: No    Home Medications Prior to Admission medications   Medication Sig Start Date End Date Taking? Authorizing Provider  amLODipine (NORVASC) 5 MG tablet Take 5 mg by mouth daily. 07/07/17   [provider]  atorvastatin (LIPITOR) 20 MG tablet Take 20 mg by mouth daily.    [provider]  benzonatate (TESSALON) 100 MG capsule Take 2 capsules (200 mg total) by mouth 3 (three) times daily as needed. 06/04/20   Burgess Amor, PA-C  metFORMIN (GLUCOPHAGE) 500 MG tablet Take 1 tablet (500 mg total) by mouth 2 (two) times daily with a meal. Patient taking differently: Take 1,000 mg by mouth at bedtime.  08/11/17   Amin, Loura Halt, MD  OXYGEN Inhale 2-3 L into the lungs daily as needed (for shortness of breath).     [provider]    Allergies    Patient has no known allergies.  Review of Systems   Review of Systems  Constitutional: Positive for fatigue. Negative for chills and fever.  HENT: Positive for sore throat. Negative for ear pain.   Eyes: Negative for pain and visual disturbance.  Respiratory: Positive for cough. Negative for shortness of breath.  Cardiovascular: Negative for chest pain and palpitations.  Gastrointestinal: Negative for abdominal pain and vomiting.  Genitourinary: Negative for dysuria and hematuria.  Musculoskeletal: Negative for arthralgias and back pain.  Skin: Negative for color change and rash.  Neurological: Positive for weakness. Negative for seizures and syncope.  All other systems reviewed and are negative.   Physical Exam Updated Vital Signs BP (!) 155/75   Pulse 64   Temp 98.4 F (36.9 C) (Oral)   Resp (!) 23   Ht 5\' 7"  (1.702 m)   Wt 83.9 kg   SpO2 97%   BMI 28.98 kg/m   Physical Exam Vitals  reviewed.  Constitutional:      General: She is not in acute distress.    Appearance: Normal appearance. She is not ill-appearing, toxic-appearing or diaphoretic.  HENT:     Head: Normocephalic and atraumatic.  Eyes:     General:        Right eye: No discharge.        Left eye: No discharge.     Extraocular Movements: Extraocular movements intact.     Conjunctiva/sclera: Conjunctivae normal.  Cardiovascular:     Rate and Rhythm: Normal rate and regular rhythm.  Pulmonary:     Effort: Pulmonary effort is normal.     Breath sounds: Normal breath sounds.  Abdominal:     General: Abdomen is flat.     Tenderness: There is no right CVA tenderness or left CVA tenderness.  Musculoskeletal:        General: No swelling. Normal range of motion.  Skin:    General: Skin is warm and dry.  Neurological:     General: No focal deficit present.     Mental Status: She is alert and oriented to person, place, and time.  Psychiatric:        Mood and Affect: Mood normal.        Behavior: Behavior normal.     ED Results / Procedures / Treatments   Labs (all labs ordered are listed, but only abnormal results are displayed) Labs Reviewed  CBC WITH DIFFERENTIAL/PLATELET - Abnormal; Notable for the following components:      Result Value   WBC 14.6 (*)    RBC 5.36 (*)    HCT 47.7 (*)    Neutro Abs 11.5 (*)    Monocytes Absolute 1.3 (*)    Abs Immature Granulocytes 0.11 (*)    All other components within normal limits  COMPREHENSIVE METABOLIC PANEL - Abnormal; Notable for the following components:   Sodium 134 (*)    Calcium 8.5 (*)    AST 11 (*)    All other components within normal limits  RESP PANEL BY RT-PCR (FLU A&B, COVID) ARPGX2  LACTIC ACID, PLASMA  LACTIC ACID, PLASMA    EKG None  Radiology DG Chest 2 View  Result Date: 10/30/2020 CLINICAL DATA:  Weakness, cough and congestion since yesterday. EXAM: CHEST - 2 VIEW COMPARISON:  Single-view of the chest 06/04/2020. PA and  lateral chest and CT chest 06/28/2018. FINDINGS: Lungs are clear with emphysematous disease noted. Cardiomegaly is seen. No pneumothorax or pleural effusion. No acute or focal bony abnormality. IMPRESSION: No acute disease. Cardiomegaly. Emphysema (ICD10-J43.9). Electronically Signed   By: 08/27/2018 M.D.   On: 10/30/2020 13:21    Procedures Procedures   Medications Ordered in ED Medications - No data to display  ED Course  I have reviewed the triage vital signs and the nursing notes.  Pertinent labs &  imaging results that were available during my care of the patient were reviewed by me and considered in my medical decision making (see chart for details).    MDM Rules/Calculators/A&P                          65 year old female who presents to the ER with complaints of sore throat, fatigue, cough which began yesterday.  On arrival, she did arrive hypoxic on room air at 83%, however reports chronic use of oxygen at home when she "needs it".  Per chart review, she has presented on room air with this oxygen level in the past.  She states that this is not uncommon her to be hypoxic, patient with a history of COPD.  She is in no respiratory distress, resting comfortably in the ER bed.  She was placed on 3 L nasal cannula, sats improved to the mid 90s.  Physical exam overall reassuring, lung sounds clear.  CBC with a leukocytosis of 14.6,, normal hemoglobin.  CMP with mild hyponatremia 134.  Lactic acid normal.  COVID and flu are negative.  Chest x-ray without evidence of pneumonia.  Overall work-up reassuring.  Patient ambulated on RA and become hypoxic to 85%, however ambulated on her 3 L and stayed at 95%.  No signs of sepsis, low suspicion for PE. No shortness of breath or CP.  Again per chart review, patient seems to have a history of chronic hypoxia.   I discussed the findings with the patient who is agreeable to discharge.  Encourage supportive care.  We discussed return precautions and  CP follow-up.  She voiced understanding and is agreeable.  Final Clinical Impression(s) / ED Diagnoses Final diagnoses:  Viral URI with cough    Rx / DC Orders ED Discharge Orders    None       Leone Brand 10/30/20 2100    Gerhard Munch, MD 10/30/20 [phone]

## 2020-10-30 NOTE — Discharge Instructions (Signed)
You were evaluated in the Emergency Department and after careful evaluation, we did not find any emergent condition requiring admission or further testing in the hospital.  Work-up today was overall reassuring.  Your chest x-ray did not show any signs of pneumonia.  Your COVID and flu test were negative.  Please return to the Emergency Department if you experience any worsening of your condition.  We encourage you to follow up with a primary care provider.  Thank you for allowing [place] to be a part of your care.

## 2020-10-30 NOTE — ED Notes (Addendum)
Ambulated pt. 100 ft. Pt. Dropped to 85 on RA. Placed pt. Back on 3 L of O2 pt is at 93 O2. Ambulated pt 50 ft. On 3 L of 02 pt. Stayed at 100.

## 2020-10-30 NOTE — ED Notes (Signed)
Placed pt. On 4 L of O2 nasal cannula. Pt. Is now 93 O2.

## 2020-11-28 DIAGNOSIS — I10 Essential (primary) hypertension: Secondary | ICD-10-CM | POA: Diagnosis not present

## 2020-11-28 DIAGNOSIS — E1169 Type 2 diabetes mellitus with other specified complication: Secondary | ICD-10-CM | POA: Diagnosis not present

## 2020-11-28 DIAGNOSIS — E785 Hyperlipidemia, unspecified: Secondary | ICD-10-CM | POA: Diagnosis not present

## 2020-11-28 DIAGNOSIS — J449 Chronic obstructive pulmonary disease, unspecified: Secondary | ICD-10-CM | POA: Diagnosis not present

## 2020-11-28 DIAGNOSIS — B353 Tinea pedis: Secondary | ICD-10-CM | POA: Diagnosis not present

## 2021-01-23 ENCOUNTER — Other Ambulatory Visit: Payer: Self-pay

## 2021-01-23 ENCOUNTER — Emergency Department (HOSPITAL_COMMUNITY): Payer: BC Managed Care – PPO

## 2021-01-23 ENCOUNTER — Inpatient Hospital Stay (HOSPITAL_COMMUNITY)
Admission: EM | Admit: 2021-01-23 | Discharge: 2021-01-25 | DRG: 177 | Disposition: A | Discharge status: Home / Self Care | Payer: BC Managed Care – PPO | Attending: Family Medicine | Admitting: Family Medicine

## 2021-01-23 DIAGNOSIS — Z825 Family history of asthma and other chronic lower respiratory diseases: Secondary | ICD-10-CM | POA: Diagnosis not present

## 2021-01-23 DIAGNOSIS — J1282 Pneumonia due to coronavirus disease 2019: Secondary | ICD-10-CM | POA: Diagnosis present

## 2021-01-23 DIAGNOSIS — A0839 Other viral enteritis: Secondary | ICD-10-CM | POA: Diagnosis not present

## 2021-01-23 DIAGNOSIS — Z87891 Personal history of nicotine dependence: Secondary | ICD-10-CM | POA: Diagnosis not present

## 2021-01-23 DIAGNOSIS — E119 Type 2 diabetes mellitus without complications: Secondary | ICD-10-CM

## 2021-01-23 DIAGNOSIS — J9811 Atelectasis: Secondary | ICD-10-CM | POA: Diagnosis not present

## 2021-01-23 DIAGNOSIS — J440 Chronic obstructive pulmonary disease with acute lower respiratory infection: Secondary | ICD-10-CM | POA: Diagnosis not present

## 2021-01-23 DIAGNOSIS — J9601 Acute respiratory failure with hypoxia: Secondary | ICD-10-CM | POA: Diagnosis not present

## 2021-01-23 DIAGNOSIS — R059 Cough, unspecified: Secondary | ICD-10-CM | POA: Diagnosis not present

## 2021-01-23 DIAGNOSIS — Z20822 Contact with and (suspected) exposure to covid-19: Secondary | ICD-10-CM | POA: Diagnosis present

## 2021-01-23 DIAGNOSIS — Z79899 Other long term (current) drug therapy: Secondary | ICD-10-CM

## 2021-01-23 DIAGNOSIS — R531 Weakness: Secondary | ICD-10-CM | POA: Diagnosis not present

## 2021-01-23 DIAGNOSIS — J449 Chronic obstructive pulmonary disease, unspecified: Secondary | ICD-10-CM | POA: Diagnosis not present

## 2021-01-23 DIAGNOSIS — E1165 Type 2 diabetes mellitus with hyperglycemia: Secondary | ICD-10-CM | POA: Diagnosis not present

## 2021-01-23 DIAGNOSIS — T380X5A Adverse effect of glucocorticoids and synthetic analogues, initial encounter: Secondary | ICD-10-CM | POA: Diagnosis not present

## 2021-01-23 DIAGNOSIS — I517 Cardiomegaly: Secondary | ICD-10-CM | POA: Diagnosis not present

## 2021-01-23 DIAGNOSIS — Z7984 Long term (current) use of oral hypoglycemic drugs: Secondary | ICD-10-CM | POA: Diagnosis not present

## 2021-01-23 DIAGNOSIS — U071 COVID-19: Principal | ICD-10-CM | POA: Diagnosis present

## 2021-01-23 DIAGNOSIS — I10 Essential (primary) hypertension: Secondary | ICD-10-CM | POA: Diagnosis present

## 2021-01-23 LAB — COMPREHENSIVE METABOLIC PANEL
ALT: 11 U/L (ref 0–44)
AST: 13 U/L — ABNORMAL LOW (ref 15–41)
Albumin: 3.8 g/dL (ref 3.5–5.0)
Alkaline Phosphatase: 69 U/L (ref 38–126)
Anion gap: 10 (ref 5–15)
BUN: 19 mg/dL (ref 8–23)
CO2: 23 mmol/L (ref 22–32)
Calcium: 8.3 mg/dL — ABNORMAL LOW (ref 8.9–10.3)
Chloride: 102 mmol/L (ref 98–111)
Creatinine, Ser: 1.05 mg/dL — ABNORMAL HIGH (ref 0.44–1.00)
GFR, Estimated: 60 mL/min — ABNORMAL LOW (ref >60)
Glucose, Bld: 100 mg/dL — ABNORMAL HIGH (ref 70–99)
Potassium: 3.7 mmol/L (ref 3.5–5.1)
Sodium: 135 mmol/L (ref 135–145)
Total Bilirubin: 0.5 mg/dL (ref 0.3–1.2)
Total Protein: 8.2 g/dL — ABNORMAL HIGH (ref 6.5–8.1)

## 2021-01-23 LAB — LACTIC ACID, PLASMA
Lactic Acid, Venous: 1.3 mmol/L (ref 0.5–1.9)
Lactic Acid, Venous: 0.9 mmol/L (ref 0.5–1.9)

## 2021-01-23 LAB — FERRITIN: Ferritin: 100 ng/mL (ref 11–307)

## 2021-01-23 LAB — CBC WITH DIFFERENTIAL/PLATELET
Abs Immature Granulocytes: 0.02 10*3/uL (ref 0.00–0.07)
Basophils Absolute: 0 10*3/uL (ref 0.0–0.1)
Basophils Relative: 0 %
Eosinophils Absolute: 0 10*3/uL (ref 0.0–0.5)
Eosinophils Relative: 0 %
HCT: 47.3 % — ABNORMAL HIGH (ref 36.0–46.0)
Hemoglobin: 14.4 g/dL (ref 12.0–15.0)
Immature Granulocytes: 0 %
Lymphocytes Relative: 22 %
Lymphs Abs: 1.3 10*3/uL (ref 0.7–4.0)
MCH: 27.2 pg (ref 26.0–34.0)
MCHC: 30.4 g/dL (ref 30.0–36.0)
MCV: 89.4 fL (ref 80.0–100.0)
Monocytes Absolute: 1 10*3/uL (ref 0.1–1.0)
Monocytes Relative: 16 %
Neutro Abs: 3.6 10*3/uL (ref 1.7–7.7)
Neutrophils Relative %: 62 %
Platelets: 175 10*3/uL (ref 150–400)
RBC: 5.29 MIL/uL — ABNORMAL HIGH (ref 3.87–5.11)
RDW: 15.8 % — ABNORMAL HIGH (ref 11.5–15.5)
WBC: 5.9 10*3/uL (ref 4.0–10.5)
nRBC: 0 % (ref 0.0–0.2)

## 2021-01-23 LAB — RESP PANEL BY RT-PCR (FLU A&B, COVID) ARPGX2
Influenza A by PCR: NEGATIVE
Influenza B by PCR: NEGATIVE
SARS Coronavirus 2 by RT PCR: POSITIVE — AB

## 2021-01-23 LAB — APTT: aPTT: 27 seconds (ref 24–36)

## 2021-01-23 LAB — PROTIME-INR
INR: 1 (ref 0.8–1.2)
Prothrombin Time: 13.5 seconds (ref 11.4–15.2)

## 2021-01-23 LAB — LACTATE DEHYDROGENASE: LDH: 108 U/L (ref 98–192)

## 2021-01-23 LAB — C-REACTIVE PROTEIN: CRP: 5.1 mg/dL — ABNORMAL HIGH (ref <1.0)

## 2021-01-23 LAB — GLUCOSE, CAPILLARY: Glucose-Capillary: 115 mg/dL — ABNORMAL HIGH (ref 70–99)

## 2021-01-23 MED ORDER — INSULIN ASPART 100 UNIT/ML IJ SOLN
0.0000 [IU] | Freq: Three times a day (TID) | INTRAMUSCULAR | Status: DC
  Administered 2021-01-24: 5 [IU] via SUBCUTANEOUS
  Administered 2021-01-24: 3 [IU] via SUBCUTANEOUS
  Administered 2021-01-24 – 2021-01-25 (×2): 2 [IU] via SUBCUTANEOUS
  Administered 2021-01-25: 3 [IU] via SUBCUTANEOUS

## 2021-01-23 MED ORDER — INSULIN ASPART 100 UNIT/ML IJ SOLN: 0.0000 [IU] | Freq: Every day | INTRAMUSCULAR | Status: DC

## 2021-01-23 MED ORDER — DM-GUAIFENESIN ER 30-600 MG PO TB12: 1.0000 | ORAL_TABLET | Freq: Two times a day (BID) | ORAL | Status: DC | PRN

## 2021-01-23 MED ORDER — ONDANSETRON HCL 4 MG/2ML IJ SOLN: 4.0000 mg | Freq: Four times a day (QID) | INTRAMUSCULAR | Status: DC | PRN

## 2021-01-23 MED ORDER — ACETAMINOPHEN 650 MG RE SUPP: 650.0000 mg | Freq: Four times a day (QID) | RECTAL | Status: DC | PRN

## 2021-01-23 MED ORDER — ONDANSETRON HCL 4 MG PO TABS: 4.0000 mg | ORAL_TABLET | Freq: Four times a day (QID) | ORAL | Status: DC | PRN

## 2021-01-23 MED ORDER — ACETAMINOPHEN 325 MG PO TABS
650.0000 mg | ORAL_TABLET | Freq: Once | ORAL | Status: AC
  Administered 2021-01-23: 650 mg via ORAL
  Filled 2021-01-23: qty 2

## 2021-01-23 MED ORDER — AMLODIPINE BESYLATE 5 MG PO TABS
5.0000 mg | ORAL_TABLET | Freq: Every day | ORAL | Status: DC
  Administered 2021-01-24 – 2021-01-25 (×2): 5 mg via ORAL
  Filled 2021-01-23 (×2): qty 1

## 2021-01-23 MED ORDER — DEXAMETHASONE 4 MG PO TABS
6.0000 mg | ORAL_TABLET | Freq: Every day | ORAL | Status: DC
  Administered 2021-01-24 – 2021-01-25 (×2): 6 mg via ORAL
  Filled 2021-01-23 (×2): qty 2

## 2021-01-23 MED ORDER — SODIUM CHLORIDE 0.9 % IV SOLN
100.0000 mg | INTRAVENOUS | Status: AC
  Administered 2021-01-23 (×2): 100 mg via INTRAVENOUS
  Filled 2021-01-23 (×2): qty 20

## 2021-01-23 MED ORDER — ALBUTEROL SULFATE (2.5 MG/3ML) 0.083% IN NEBU: 2.5000 mg | INHALATION_SOLUTION | RESPIRATORY_TRACT | Status: DC | PRN

## 2021-01-23 MED ORDER — SODIUM CHLORIDE 0.9 % IV SOLN
100.0000 mg | Freq: Every day | INTRAVENOUS | Status: DC
  Administered 2021-01-24 – 2021-01-25 (×2): 100 mg via INTRAVENOUS
  Filled 2021-01-23 (×2): qty 20

## 2021-01-23 MED ORDER — SODIUM CHLORIDE 0.9 % IV BOLUS
1000.0000 mL | Freq: Once | INTRAVENOUS | Status: AC
  Administered 2021-01-23: 1000 mL via INTRAVENOUS

## 2021-01-23 MED ORDER — ACETAMINOPHEN 325 MG PO TABS: 650.0000 mg | ORAL_TABLET | Freq: Four times a day (QID) | ORAL | Status: DC | PRN

## 2021-01-23 MED ORDER — DEXAMETHASONE SODIUM PHOSPHATE 10 MG/ML IJ SOLN
6.0000 mg | Freq: Once | INTRAMUSCULAR | Status: AC
  Administered 2021-01-23: 6 mg via INTRAVENOUS
  Filled 2021-01-23: qty 1

## 2021-01-23 MED ORDER — ENOXAPARIN SODIUM 40 MG/0.4ML IJ SOSY
40.0000 mg | PREFILLED_SYRINGE | INTRAMUSCULAR | Status: DC
  Administered 2021-01-24: 40 mg via SUBCUTANEOUS
  Filled 2021-01-23: qty 0.4

## 2021-01-23 NOTE — ED Notes (Signed)
Placed on o2 at 4l/min to get oxygen sat to 90 %

## 2021-01-23 NOTE — Assessment & Plan Note (Signed)
Continue supplemental O2. Wean as tolerated

## 2021-01-23 NOTE — Assessment & Plan Note (Signed)
Admit to medical bed. Continue with po Decadron and IV remdesivir. Follow inflammatory markers. SQ lovenox for DVT prophylaxis.

## 2021-01-23 NOTE — ED Triage Notes (Signed)
C/o weakness onset 3 days ago, possible covid

## 2021-01-23 NOTE — ED Provider Notes (Signed)
[facility] EMERGENCY DEPARTMENT Provider Note   CSN: [id number] Arrival date & time: 01/23/21  1435     History Chief Complaint  Patient presents with   Weakness    Rita Jackson is a 65 y.o. female.  HPI 66 year old female presents with generalized weakness.  Started 2 days ago feeling fatigued and with no appetite.  Yesterday she developed other symptoms including cough and sore throat.  She was found to have a low-grade fever of 100 here.  No fevers at home.  Feels somewhat short of breath as well.  She has oxygen that she uses at night that she has been using all day today due to some dyspnea. She is able to eat but has poor appetite.  Found to have O2 sats of 75% on room air.  Past Medical History:  Diagnosis Date   COPD (chronic obstructive pulmonary disease) (HCC)    Diabetes mellitus without complication (HCC)    Hypertension    Pneumonia    Tobacco abuse     Patient Active Problem List   Diagnosis Date Noted   Pneumonia due to COVID-19 virus 01/23/2021   Acute hypoxemic respiratory failure due to COVID-19 (HCC) 01/23/2021   Type 2 diabetes mellitus without complication (HCC) 01/23/2021   Chain smoker 06/28/2018   COPD (chronic obstructive pulmonary disease) (HCC) 06/28/2018   Essential hypertension 08/09/2017   Mass of upper lobe of right lung 08/09/2017   Respiratory failure with hypoxia (HCC) 08/08/2017   Acute on chronic respiratory failure with hypoxia (HCC) 06/23/2017   Tobacco abuse 06/23/2017    No past surgical history on file.   OB History   No obstetric history on file.     Family History  Problem Relation Age of Onset   COPD Sister     Social History   Tobacco Use   Smoking status: Former    Packs/day: 1.00    Types: Cigarettes   Smokeless tobacco: Never  Vaping Use   Vaping Use: Never used  Substance Use Topics   Alcohol use: No   Drug use: No    Home Medications Prior to Admission medications   Medication Sig Start Date  End Date Taking? Authorizing Provider  amLODipine (NORVASC) 5 MG tablet Take 5 mg by mouth daily. 07/07/17   [provider]  atorvastatin (LIPITOR) 20 MG tablet Take 20 mg by mouth daily.    [provider]  benzonatate (TESSALON) 100 MG capsule Take 2 capsules (200 mg total) by mouth 3 (three) times daily as needed. 06/04/20   Burgess Amor, PA-C  metFORMIN (GLUCOPHAGE) 500 MG tablet Take 1 tablet (500 mg total) by mouth 2 (two) times daily with a meal. Patient taking differently: Take 1,000 mg by mouth at bedtime.  08/11/17   Amin, Loura Halt, MD  OXYGEN Inhale 2-3 L into the lungs daily as needed (for shortness of breath).     [provider]    Allergies    Patient has no known allergies.  Review of Systems   Review of Systems  Constitutional:  Positive for fatigue. Negative for fever.  HENT:  Positive for sore throat.   Respiratory:  Positive for cough and shortness of breath.   Cardiovascular:  Negative for chest pain.  All other systems reviewed and are negative.  Physical Exam Updated Vital Signs BP (!) 135/54 (BP Location: Right Arm)   Pulse (!) 54   Temp 98.1 F (36.7 C) (Oral)   Resp 18   Ht 5'  7" (1.702 m)   Wt 84.4 kg   SpO2 98%   BMI 29.14 kg/m   Physical Exam Vitals and nursing note reviewed.  Constitutional:      General: She is not in acute distress.    Appearance: She is well-developed. She is not ill-appearing or diaphoretic.  HENT:     Head: Normocephalic and atraumatic.     Right Ear: External ear normal.     Left Ear: External ear normal.     Nose: Nose normal.  Eyes:     General:        Right eye: No discharge.        Left eye: No discharge.  Cardiovascular:     Rate and Rhythm: Normal rate and regular rhythm.     Heart sounds: Normal heart sounds.  Pulmonary:     Effort: Pulmonary effort is normal.     Breath sounds: Normal breath sounds. No wheezing.  Abdominal:     Palpations: Abdomen is soft.     Tenderness:  There is no abdominal tenderness.  Skin:    General: Skin is warm and dry.  Neurological:     Mental Status: She is alert.  Psychiatric:        Mood and Affect: Mood is not anxious.    ED Results / Procedures / Treatments   Labs (all labs ordered are listed, but only abnormal results are displayed) Labs Reviewed  RESP PANEL BY RT-PCR (FLU A&B, COVID) ARPGX2 - Abnormal; Notable for the following components:      Result Value   SARS Coronavirus 2 by RT PCR POSITIVE (*)    All other components within normal limits  COMPREHENSIVE METABOLIC PANEL - Abnormal; Notable for the following components:   Glucose, Bld 100 (*)    Creatinine, Ser 1.05 (*)    Calcium 8.3 (*)    Total Protein 8.2 (*)    AST 13 (*)    GFR, Estimated 60 (*)    All other components within normal limits  CBC WITH DIFFERENTIAL/PLATELET - Abnormal; Notable for the following components:   RBC 5.29 (*)    HCT 47.3 (*)    RDW 15.8 (*)    All other components within normal limits  C-REACTIVE PROTEIN - Abnormal; Notable for the following components:   CRP 5.1 (*)    All other components within normal limits  GLUCOSE, CAPILLARY - Abnormal; Notable for the following components:   Glucose-Capillary 115 (*)    All other components within normal limits  CULTURE, BLOOD (ROUTINE X 2)  CULTURE, BLOOD (ROUTINE X 2)  URINE CULTURE  LACTIC ACID, PLASMA  LACTIC ACID, PLASMA  PROTIME-INR  APTT  LACTATE DEHYDROGENASE  FERRITIN  URINALYSIS, ROUTINE W REFLEX MICROSCOPIC  HEMOGLOBIN A1C  FERRITIN  LACTATE DEHYDROGENASE  C-REACTIVE PROTEIN  HEMOGLOBIN A1C  HIV ANTIBODY (ROUTINE TESTING W REFLEX)  COMPREHENSIVE METABOLIC PANEL  CBC WITH DIFFERENTIAL/PLATELET  CBG MONITORING, ED    EKG None  Radiology DG Chest Port 1 View  Result Date: 01/23/2021 CLINICAL DATA:  Weakness, cough, shortness of breath EXAM: PORTABLE CHEST 1 VIEW COMPARISON:  Chest radiograph 11/29/2020 FINDINGS: The heart is enlarged, unchanged. The  mediastinal contours are stable. There are increased interstitial markings bilaterally, likely chronic. Linear opacities in the left base likely reflect subsegmental atelectasis. There is no focal consolidation or overt pulmonary edema. There is no pleural effusion. There is no pneumothorax. There is no acute osseous abnormality. IMPRESSION: Stable cardiomegaly with left basilar atelectasis. No radiographic  evidence of acute cardiopulmonary process. Electronically Signed   By: Lesia Hausen M.D.   On: 01/23/2021 17:29    Procedures Procedures   Medications Ordered in ED Medications  remdesivir 100 mg in sodium chloride 0.9 % 100 mL IVPB (100 mg Intravenous New Bag/Given 01/23/21 1914)    Followed by  remdesivir 100 mg in sodium chloride 0.9 % 100 mL IVPB (has no administration in time range)  dexamethasone (DECADRON) tablet 6 mg (has no administration in time range)  insulin aspart (novoLOG) injection 0-15 Units (has no administration in time range)  insulin aspart (novoLOG) injection 0-5 Units (has no administration in time range)  amLODipine (NORVASC) tablet 5 mg (has no administration in time range)  enoxaparin (LOVENOX) injection 40 mg (has no administration in time range)  acetaminophen (TYLENOL) tablet 650 mg (has no administration in time range)    Or  acetaminophen (TYLENOL) suppository 650 mg (has no administration in time range)  ondansetron (ZOFRAN) tablet 4 mg (has no administration in time range)    Or  ondansetron (ZOFRAN) injection 4 mg (has no administration in time range)  dextromethorphan-guaiFENesin (MUCINEX DM) 30-600 MG per 12 hr tablet 1 tablet (has no administration in time range)  albuterol (PROVENTIL) (2.5 MG/3ML) 0.083% nebulizer solution 2.5 mg (has no administration in time range)  acetaminophen (TYLENOL) tablet 650 mg (650 mg Oral Given 01/23/21 1638)  sodium chloride 0.9 % bolus 1,000 mL (0 mLs Intravenous Stopped 01/23/21 1801)  dexamethasone (DECADRON) injection 6  mg (6 mg Intravenous Given 01/23/21 1913)    ED Course  I have reviewed the triage vital signs and the nursing notes.  Pertinent labs & imaging results that were available during my care of the patient were reviewed by me and considered in my medical decision making (see chart for details).    MDM Rules/Calculators/A&P                           Patient is hypoxic from COVID-19.  While she does have oxygen at home, this does portend a more ill diagnosis and I think she will need IV steroids and remdesivir.  No evidence of bacterial infection.  Will admit. Final Clinical Impression(s) / ED Diagnoses Final diagnoses:  Acute respiratory failure with hypoxia (HCC)  COVID-19 virus infection    Rx / DC Orders ED Discharge Orders     None        Pricilla Loveless, MD 01/23/21 2356

## 2021-01-23 NOTE — Assessment & Plan Note (Signed)
Continue norvasc 

## 2021-01-23 NOTE — Assessment & Plan Note (Signed)
Prn duonebs. 

## 2021-01-23 NOTE — H&P (Signed)
History and Physical    LAM BJORKLUND FWY:[id number] DOB: 1955/10/20 DOA: 01/23/2021  PCP: Mirna Mires, MD   Patient coming from: Home  I have personally briefly reviewed patient's old medical records in  Link  CC: SOB, cough HPI: 65 year old African-American female with a history of type 2 diabetes, COPD, intermittent hypoxia, hypertension who presents to the ER today with a 3-day history of feeling bad at home.  Patient states symptoms started on Sunday.  She had some diarrhea.  She states that she had body aches all over.  Patient has been vaccinated with COVID with a booster.  She denies having any contact with any COVID-positive people.  Patient's been using her supplemental oxygen at home over the last 3 days.  She normally only uses as needed.  She has been feeling increasing short of breath.  Diarrhea stopped after 1 day.  She has been having a cough.   On arrival to the ER, the patient a low-grade temperature of 100.0.  Room air O2 saturations are 75%.  She started on 4 L of oxygen.  Chest x-ray demonstrates mild bilateral infiltrates.  COVID test was positive.  Due to her new onset hypoxia and COVID-pneumonia, [facility] hospitalist contacted for admission.   ED Course: RA hypoxia down to 75%. Started on supplemental O2. Covid PCR positive. Started on decadron and remdesivir.  Review of Systems:  Review of Systems  Constitutional:  Positive for fever and malaise/fatigue.  HENT: Negative.    Eyes: Negative.   Respiratory:  Positive for cough and shortness of breath.   Cardiovascular: Negative.   Gastrointestinal:  Positive for diarrhea.  Genitourinary: Negative.   Musculoskeletal:  Positive for myalgias.  Skin: Negative.   Neurological: Negative.   Endo/Heme/Allergies: Negative.   Psychiatric/Behavioral: Negative.    All other systems reviewed and are negative.  Past Medical History:  Diagnosis Date   COPD (chronic obstructive pulmonary disease) (HCC)     Diabetes mellitus without complication (HCC)    Hypertension    Pneumonia    Tobacco abuse     No past surgical history on file.   reports that she has quit smoking. Her smoking use included cigarettes. She smoked an average of 1 pack per day. She has never used smokeless tobacco. She reports that she does not drink alcohol and does not use drugs.  No Known Allergies  Family History  Problem Relation Age of Onset   COPD Sister     Prior to Admission medications   Medication Sig Start Date End Date Taking? Authorizing Provider  amLODipine (NORVASC) 5 MG tablet Take 5 mg by mouth daily. 07/07/17   [provider]  atorvastatin (LIPITOR) 20 MG tablet Take 20 mg by mouth daily.    [provider]  benzonatate (TESSALON) 100 MG capsule Take 2 capsules (200 mg total) by mouth 3 (three) times daily as needed. 06/04/20   Burgess Amor, PA-C  metFORMIN (GLUCOPHAGE) 500 MG tablet Take 1 tablet (500 mg total) by mouth 2 (two) times daily with a meal. Patient taking differently: Take 1,000 mg by mouth at bedtime.  08/11/17   Amin, Loura Halt, MD  OXYGEN Inhale 2-3 L into the lungs daily as needed (for shortness of breath).     [provider]    Physical Exam: Vitals:   01/23/21 1522 01/23/21 1624 01/23/21 1801  BP: 133/61  134/68  Pulse: 90  88  Resp: 16  17  Temp: 100 F (37.8 C)  TempSrc: Oral    SpO2: (!) 75% 91% 92%    Physical Exam Vitals and nursing note reviewed.  Constitutional:      General: She is not in acute distress.    Appearance: She is not ill-appearing, toxic-appearing or diaphoretic.  HENT:     Head: Normocephalic and atraumatic.     Nose: No rhinorrhea.  Eyes:     General: No scleral icterus. Cardiovascular:     Rate and Rhythm: Normal rate and regular rhythm.     Pulses: Normal pulses.  Pulmonary:     Effort: No accessory muscle usage, prolonged expiration, respiratory distress or retractions.     Breath sounds: Examination of  the right-lower field reveals rales. Examination of the left-lower field reveals rales. Rales present. No wheezing or rhonchi.  Abdominal:     General: Abdomen is flat. Bowel sounds are normal. There is no distension.     Palpations: Abdomen is soft.     Tenderness: There is no abdominal tenderness. There is no guarding.  Musculoskeletal:     Right lower leg: No edema.     Left lower leg: No edema.  Skin:    General: Skin is warm and dry.     Capillary Refill: Capillary refill takes less than 2 seconds.  Neurological:     General: No focal deficit present.     Mental Status: She is alert and oriented to person, place, and time.     Labs on Admission: I have personally reviewed following labs and imaging studies  CBC: Recent Labs  Lab 01/23/21 1627  WBC 5.9  NEUTROABS 3.6  HGB 14.4  HCT 47.3*  MCV 89.4  PLT 175   Basic Metabolic Panel: Recent Labs  Lab 01/23/21 1627  NA 135  K 3.7  CL 102  CO2 23  GLUCOSE 100*  BUN 19  CREATININE 1.05*  CALCIUM 8.3*   GFR: CrCl cannot be calculated (Unknown ideal weight.). Liver Function Tests: Recent Labs  Lab 01/23/21 1627  AST 13*  ALT 11  ALKPHOS 69  BILITOT 0.5  PROT 8.2*  ALBUMIN 3.8   No results for input(s): LIPASE, AMYLASE in the last 168 hours. No results for input(s): AMMONIA in the last 168 hours. Coagulation Profile: Recent Labs  Lab 01/23/21 1627  INR 1.0   Cardiac Enzymes: No results for input(s): CKTOTAL, CKMB, CKMBINDEX, TROPONINI in the last 168 hours. BNP (last 3 results) No results for input(s): PROBNP in the last 8760 hours. HbA1C: No results for input(s): HGBA1C in the last 72 hours. CBG: No results for input(s): GLUCAP in the last 168 hours. Lipid Profile: No results for input(s): CHOL, HDL, LDLCALC, TRIG, CHOLHDL, LDLDIRECT in the last 72 hours. Thyroid Function Tests: No results for input(s): TSH, T4TOTAL, FREET4, T3FREE, THYROIDAB in the last 72 hours. Anemia Panel: No results for  input(s): VITAMINB12, FOLATE, FERRITIN, TIBC, IRON, RETICCTPCT in the last 72 hours. Urine analysis:    Component Value Date/Time   COLORURINE AMBER (A) 06/04/2020 1130   APPEARANCEUR CLOUDY (A) 06/04/2020 1130   LABSPEC 1.025 06/04/2020 1130   PHURINE 5.0 06/04/2020 1130   GLUCOSEU NEGATIVE 06/04/2020 1130   HGBUR NEGATIVE 06/04/2020 1130   BILIRUBINUR NEGATIVE 06/04/2020 1130   KETONESUR 20 (A) 06/04/2020 1130   PROTEINUR 30 (A) 06/04/2020 1130   NITRITE NEGATIVE 06/04/2020 1130   LEUKOCYTESUR NEGATIVE 06/04/2020 1130    Radiological Exams on Admission: I have personally reviewed images DG Chest Port 1 View  Result Date: 01/23/2021 CLINICAL  DATA:  Weakness, cough, shortness of breath EXAM: PORTABLE CHEST 1 VIEW COMPARISON:  Chest radiograph 11/29/2020 FINDINGS: The heart is enlarged, unchanged. The mediastinal contours are stable. There are increased interstitial markings bilaterally, likely chronic. Linear opacities in the left base likely reflect subsegmental atelectasis. There is no focal consolidation or overt pulmonary edema. There is no pleural effusion. There is no pneumothorax. There is no acute osseous abnormality. IMPRESSION: Stable cardiomegaly with left basilar atelectasis. No radiographic evidence of acute cardiopulmonary process. Electronically Signed   By: Lesia Hausen M.D.   On: 01/23/2021 17:29    EKG: I have personally reviewed EKG: shows NSR   Assessment/Plan Principal Problem:   Pneumonia due to COVID-19 virus Active Problems:   Acute hypoxemic respiratory failure due to COVID-19 [facility])   Essential hypertension   COPD (chronic obstructive pulmonary disease) (HCC)   Type 2 diabetes mellitus without complication (HCC)    Essential hypertension Continue norvasc  Pneumonia due to COVID-19 virus Admit to medical bed. Continue with po Decadron and IV remdesivir. Follow inflammatory markers. SQ lovenox for DVT prophylaxis.  Acute hypoxemic respiratory failure  due to COVID-19 (HCC) Continue supplemental O2. Wean as tolerated  COPD (chronic obstructive pulmonary disease) (HCC) Prn duonebs.  Type 2 diabetes mellitus without complication (HCC) Check a1c. Add ssi.  DVT prophylaxis: Lovenox Code Status: Full Code Family Communication: no family at bedside  Disposition Plan: return home  Consults called: none  Admission status: Inpatient, Med-Surg   Carollee Herter, DO [facility] 01/23/2021, 7:13 PM

## 2021-01-23 NOTE — Subjective & Objective (Signed)
CC: SOB, cough HPI: 65 year old African-American female with a history of type 2 diabetes, COPD, intermittent hypoxia, hypertension who presents to the ER today with a 3-day history of feeling bad at home.  Patient states symptoms started on Sunday.  She had some diarrhea.  She states that she had body aches all over.  Patient has been vaccinated with COVID with a booster.  She denies having any contact with any COVID-positive people.  Patient's been using her supplemental oxygen at home over the last 3 days.  She normally only uses as needed.  She has been feeling increasing short of breath.  Diarrhea stopped after 1 day.  She has been having a cough.   On arrival to the ER, the patient a low-grade temperature of 100.0.  Room air O2 saturations are 75%.  She started on 4 L of oxygen.  Chest x-ray demonstrates mild bilateral infiltrates.  COVID test was positive.  Due to her new onset hypoxia and COVID-pneumonia, triad hospitalist contacted for admission.

## 2021-01-23 NOTE — Assessment & Plan Note (Signed)
Check a1c. Add ssi.

## 2021-01-24 LAB — COMPREHENSIVE METABOLIC PANEL
ALT: 10 U/L (ref 0–44)
AST: 12 U/L — ABNORMAL LOW (ref 15–41)
Albumin: 3.4 g/dL — ABNORMAL LOW (ref 3.5–5.0)
Alkaline Phosphatase: 61 U/L (ref 38–126)
Anion gap: 10 (ref 5–15)
BUN: 21 mg/dL (ref 8–23)
CO2: 26 mmol/L (ref 22–32)
Calcium: 8.5 mg/dL — ABNORMAL LOW (ref 8.9–10.3)
Chloride: 103 mmol/L (ref 98–111)
Creatinine, Ser: 0.81 mg/dL (ref 0.44–1.00)
GFR, Estimated: >60 mL/min (ref >60)
Glucose, Bld: 147 mg/dL — ABNORMAL HIGH (ref 70–99)
Potassium: 3.9 mmol/L (ref 3.5–5.1)
Sodium: 139 mmol/L (ref 135–145)
Total Bilirubin: 0.3 mg/dL (ref 0.3–1.2)
Total Protein: 7.7 g/dL (ref 6.5–8.1)

## 2021-01-24 LAB — CBC WITH DIFFERENTIAL/PLATELET
Abs Immature Granulocytes: 0.01 10*3/uL (ref 0.00–0.07)
Basophils Absolute: 0 10*3/uL (ref 0.0–0.1)
Basophils Relative: 0 %
Eosinophils Absolute: 0 10*3/uL (ref 0.0–0.5)
Eosinophils Relative: 0 %
HCT: 46.6 % — ABNORMAL HIGH (ref 36.0–46.0)
Hemoglobin: 14.3 g/dL (ref 12.0–15.0)
Immature Granulocytes: 0 %
Lymphocytes Relative: 30 %
Lymphs Abs: 1.1 10*3/uL (ref 0.7–4.0)
MCH: 27 pg (ref 26.0–34.0)
MCHC: 30.7 g/dL (ref 30.0–36.0)
MCV: 88.1 fL (ref 80.0–100.0)
Monocytes Absolute: 0.3 10*3/uL (ref 0.1–1.0)
Monocytes Relative: 8 %
Neutro Abs: 2.3 10*3/uL (ref 1.7–7.7)
Neutrophils Relative %: 62 %
Platelets: 206 10*3/uL (ref 150–400)
RBC: 5.29 MIL/uL — ABNORMAL HIGH (ref 3.87–5.11)
RDW: 15.4 % (ref 11.5–15.5)
WBC: 3.8 10*3/uL — ABNORMAL LOW (ref 4.0–10.5)
nRBC: 0 % (ref 0.0–0.2)

## 2021-01-24 LAB — FERRITIN: Ferritin: 98 ng/mL (ref 11–307)

## 2021-01-24 LAB — C-REACTIVE PROTEIN: CRP: 4.9 mg/dL — ABNORMAL HIGH (ref <1.0)

## 2021-01-24 LAB — GLUCOSE, CAPILLARY
Glucose-Capillary: 144 mg/dL — ABNORMAL HIGH (ref 70–99)
Glucose-Capillary: 181 mg/dL — ABNORMAL HIGH (ref 70–99)
Glucose-Capillary: 216 mg/dL — ABNORMAL HIGH (ref 70–99)

## 2021-01-24 LAB — LACTATE DEHYDROGENASE: LDH: 110 U/L (ref 98–192)

## 2021-01-24 LAB — HIV ANTIBODY (ROUTINE TESTING W REFLEX): HIV Screen 4th Generation wRfx: NONREACTIVE

## 2021-01-24 NOTE — Progress Notes (Signed)
PROGRESS NOTE   Rita Jackson  UMP:[id number] DOB: 27-Sep-1955 DOA: 01/23/2021 PCP: Mirna Mires, MD   Chief Complaint  Patient presents with   Weakness   Level of care: Med-Surg  Brief Admission History:  65 year old African-American female with a history of type 2 diabetes, COPD, intermittent hypoxia, hypertension who presents to the ER today with a 3-day history of feeling bad at home.  Patient states symptoms started on Sunday.  She had some diarrhea.  She states that she had body aches all over.  Patient has been vaccinated with COVID with a booster.  She denies having any contact with any COVID-positive people.  Patient's been using her supplemental oxygen at home over the last 3 days.  She normally only uses as needed.  She has been feeling increasing short of breath.  Diarrhea stopped after 1 day.  She has been having a cough.    On arrival to the ER, the patient a low-grade temperature of 100.0.  Room air O2 saturations are 75%.  She started on 4 L of oxygen.  Chest x-ray demonstrates mild bilateral infiltrates.  COVID test was positive.   Due to her new onset hypoxia and COVID-pneumonia, triad hospitalist contacted for admission.    Assessment & Plan:   Principal Problem:   Pneumonia due to COVID-19 virus Active Problems:   Essential hypertension   COPD (chronic obstructive pulmonary disease) (HCC)   Acute hypoxemic respiratory failure due to COVID-19 [facility])   Type 2 diabetes mellitus without complication (HCC)   Covid Pneumonia -patient seems to be stabilizing on current regimen continue IV remdesivir, steroids and follow inflammatory markers.  Wean oxygen as able.  Acute respiratory failure with hypoxia secondary to COVID infection-wean oxygen as able.  Prior to discharge home we will do an O2 evaluation for desaturation.  COPD-continue bronchodilators therapy as ordered.  Type 2 diabetes mellitus-anticipating steroid-induced hyperglycemia continue SSI coverage  and frequent CBG monitoring as ordered.  Essential hypertension-we have resumed home amlodipine therapy.  DVT prophylaxis: Enoxaparin Code Status: Full Family Communication: Plan of care discussed with patient at bedside Disposition: Anticipate home in 1 to 2 days Status is: Inpatient  Remains inpatient appropriate because:IV treatments appropriate due to intensity of illness or inability to take PO  Dispo: The patient is from: Home              Anticipated d/c is to: Home              Patient currently is not medically stable to d/c.   Difficult to place patient No  Consultants:  N/A   Procedures:  N/a  Antimicrobials:  Remdesivir 8/30>>   Subjective: Pt reports feeling less SOB today.   Objective: Vitals:   01/23/21 2059 01/23/21 2135 01/24/21 0430 01/24/21 1126  BP: (!) 138/95 (!) 135/54 (!) 113/57 127/72  Pulse: 80 (!) 54 60 62  Resp: 20 18 17 18   Temp: 98.5 F (36.9 C) 98.1 F (36.7 C) 97.8 F (36.6 C) 97.7 F (36.5 C)  TempSrc:  Oral Oral Oral  SpO2: 98% 98% 96% 97%  Weight: 84.4 kg     Height: 5\' 7"  (1.702 m)       Intake/Output Summary (Last 24 hours) at 01/24/2021 1636 Last data filed at 01/24/2021 1300 Gross per 24 hour  Intake 480 ml  Output --  Net 480 ml   Filed Weights   01/23/21 2059  Weight: 84.4 kg    Examination:  General exam: Appears calm and comfortable  Respiratory system: rare expiratory rales heard. Respiratory effort normal. Cardiovascular system: normal S1 & S2 heard. No JVD, murmurs, rubs, gallops or clicks. No pedal edema. Gastrointestinal system: Abdomen is nondistended, soft and nontender. No organomegaly or masses felt. Normal bowel sounds heard. Central nervous system: Alert and oriented. No focal neurological deficits. Extremities: Symmetric 5 x 5 power. Skin: No rashes, lesions or ulcers Psychiatry: Judgement and insight appear normal. Mood & affect appropriate.   Data Reviewed: I have personally reviewed following  labs and imaging studies  CBC: Recent Labs  Lab 01/23/21 1627 01/24/21 0458  WBC 5.9 3.8*  NEUTROABS 3.6 2.3  HGB 14.4 14.3  HCT 47.3* 46.6*  MCV 89.4 88.1  PLT 175 206    Basic Metabolic Panel: Recent Labs  Lab 01/23/21 1627 01/24/21 0458  NA 135 139  K 3.7 3.9  CL 102 103  CO2 23 26  GLUCOSE 100* 147*  BUN 19 21  CREATININE 1.05* 0.81  CALCIUM 8.3* 8.5*    GFR: Estimated Creatinine Clearance: 79.3 mL/min (by C-G formula based on SCr of 0.81 mg/dL).  Liver Function Tests: Recent Labs  Lab 01/23/21 1627 01/24/21 0458  AST 13* 12*  ALT 11 10  ALKPHOS 69 61  BILITOT 0.5 0.3  PROT 8.2* 7.7  ALBUMIN 3.8 3.4*    CBG: Recent Labs  Lab 01/23/21 2137 01/24/21 0710 01/24/21 1120 01/24/21 1623  GLUCAP 115* 144* 181* 216*    Recent Results (from the past 240 hour(s))  Blood Culture (routine x 2)     Status: None (Preliminary result)   Collection Time: 01/23/21  4:27 PM   Specimen: BLOOD LEFT FOREARM  Result Value Ref Range Status   Specimen Description BLOOD LEFT FOREARM  Final   Special Requests   Final    BOTTLES DRAWN AEROBIC AND ANAEROBIC Blood Culture adequate volume   Culture   Final    NO GROWTH < 24 HOURS Performed at [facility], [address], [place], [place] [address]    Report Status PENDING  Incomplete  Blood Culture (routine x 2)     Status: None (Preliminary result)   Collection Time: 01/23/21  4:27 PM   Specimen: Right Antecubital; Blood  Result Value Ref Range Status   Specimen Description RIGHT ANTECUBITAL  Final   Special Requests AEROBIC BOTTLE ONLY Blood Culture adequate volume  Final   Culture   Final    NO GROWTH < 24 HOURS Performed at [facility], [address], [place], [place] [address]    Report Status PENDING  Incomplete  Resp Panel by RT-PCR (Flu A&B, Covid) Nasopharyngeal Swab     Status: Abnormal   Collection Time: 01/23/21  4:32 PM   Specimen: Nasopharyngeal Swab; Nasopharyngeal(NP) swabs in vial transport  medium  Result Value Ref Range Status   SARS Coronavirus 2 by RT PCR POSITIVE (A) NEGATIVE Final    Comment: CRITICAL RESULT CALLED TO, READ BACK BY AND VERIFIED WITH: DR.GOLDSTIN AT 1833 ON 8.30.22 BY RUCINSKI,B (NOTE) SARS-CoV-2 target nucleic acids are DETECTED.  The SARS-CoV-2 RNA is generally detectable in upper respiratory specimens during the acute phase of infection. Positive results are indicative of the presence of the identified virus, but do not rule out bacterial infection or co-infection with other pathogens not detected by the test. Clinical correlation with patient history and other diagnostic information is necessary to determine patient infection status. The expected result is Negative.  Fact Sheet for Patients: BloggerCourse.com  Fact Sheet for Healthcare Providers: SeriousBroker.it  This test is not yet approved or cleared by the [place] and  has been authorized for detection and/or diagnosis of SARS-CoV-2 by FDA under an Emergency Use Authorization (EUA).  This EUA will remain in effect (mean ing this test can be used) for the duration of  the COVID-19 declaration under Section 564(b)(1) of the Act, 21 U.S.C. section 360bbb-3(b)(1), unless the authorization is terminated or revoked sooner.     Influenza A by PCR NEGATIVE NEGATIVE Final   Influenza B by PCR NEGATIVE NEGATIVE Final    Comment: (NOTE) The Xpert Xpress SARS-CoV-2/FLU/RSV plus assay is intended as an aid in the diagnosis of influenza from Nasopharyngeal swab specimens and should not be used as a sole basis for treatment. Nasal washings and aspirates are unacceptable for Xpert Xpress SARS-CoV-2/FLU/RSV testing.  Fact Sheet for Patients: BloggerCourse.com  Fact Sheet for Healthcare Providers: SeriousBroker.it  This test is not yet approved or cleared by the [place] FDA and has  been authorized for detection and/or diagnosis of SARS-CoV-2 by FDA under an Emergency Use Authorization (EUA). This EUA will remain in effect (meaning this test can be used) for the duration of the COVID-19 declaration under Section 564(b)(1) of the Act, 21 U.S.C. section 360bbb-3(b)(1), unless the authorization is terminated or revoked.  Performed at [facility], [address], [place], [place] [address]     Radiology Studies: [facility] Chest [facility] 1 View  Result Date: 01/23/2021 CLINICAL DATA:  Weakness, cough, shortness of breath EXAM: PORTABLE CHEST 1 VIEW COMPARISON:  Chest radiograph 11/29/2020 FINDINGS: The heart is enlarged, unchanged. The mediastinal contours are stable. There are increased interstitial markings bilaterally, likely chronic. Linear opacities in the left base likely reflect subsegmental atelectasis. There is no focal consolidation or overt pulmonary edema. There is no pleural effusion. There is no pneumothorax. There is no acute osseous abnormality. IMPRESSION: Stable cardiomegaly with left basilar atelectasis. No radiographic evidence of acute cardiopulmonary process. Electronically Signed   By: Lesia Hausen M.D.   On: 01/23/2021 17:29    Scheduled Meds:  amLODipine  5 mg Oral Daily   dexamethasone  6 mg Oral Daily   enoxaparin (LOVENOX) injection  40 mg Subcutaneous Q24H   insulin aspart  0-15 Units Subcutaneous TID WC   insulin aspart  0-5 Units Subcutaneous QHS   Continuous Infusions:  remdesivir 100 mg in NS 100 mL 100 mg (01/24/21 1149)    LOS: 1 day   Time spent: 36 mins   Rommel Hogston Laural Benes, MD How to contact the [facility] Attending or Consulting provider 7A - 7P or covering provider during after hours 7P -7A, for this patient?  Check the care team in [facility] and look for a) attending/consulting [facility] provider listed and b) the [facility] team listed Log into [email or web address] and use [facility]'s universal password to access. If you do not have the password, please contact the  hospital operator. Locate the [facility] provider you are looking for under [facility] and page to a number that you can be directly reached. If you still have difficulty reaching the provider, please page the [facility] (Director on Call) for the Hospitalists listed on amion for assistance.  01/24/2021, 4:36 PM

## 2021-01-24 NOTE — Progress Notes (Signed)
Patient was able to do 750 ml on Incentive Spirometer with good patient effort.

## 2021-01-25 ENCOUNTER — Encounter (HOSPITAL_COMMUNITY): Payer: Self-pay | Admitting: Internal Medicine

## 2021-01-25 LAB — HEMOGLOBIN A1C
Hgb A1c MFr Bld: 7 % — ABNORMAL HIGH (ref 4.8–5.6)
Hgb A1c MFr Bld: 7 % — ABNORMAL HIGH (ref 4.8–5.6)
Mean Plasma Glucose: 154 mg/dL
Mean Plasma Glucose: 154 mg/dL

## 2021-01-25 LAB — GLUCOSE, CAPILLARY
Glucose-Capillary: 145 mg/dL — ABNORMAL HIGH (ref 70–99)
Glucose-Capillary: 139 mg/dL — ABNORMAL HIGH (ref 70–99)
Glucose-Capillary: 155 mg/dL — ABNORMAL HIGH (ref 70–99)
Glucose-Capillary: 156 mg/dL — ABNORMAL HIGH (ref 70–99)

## 2021-01-25 MED ORDER — DEXAMETHASONE 4 MG PO TABS: 4.0000 mg | ORAL_TABLET | Freq: Every day | ORAL | 0 refills | Status: AC

## 2021-01-25 MED ORDER — DEXTROMETHORPHAN POLISTIREX ER 30 MG/5ML PO SUER: 30.0000 mg | Freq: Two times a day (BID) | ORAL | 0 refills | Status: AC | PRN

## 2021-01-25 NOTE — Clinical Social Work Note (Signed)
Patient's oxygen is provided through [employer]. [employer] will deliver portable to patient's room.    Rita Jackson, Juleen China, LCSW

## 2021-01-25 NOTE — Progress Notes (Signed)
Pt states MD will be discharging her today. Pt has been on 2.5 - 3 lpm [place] while here in hospital. Pt states she plans on driving home with no O2. Advised pt that for her safety, we needed to verify that her SaO2 is stable on room air  both at rest and with walking. Pt agreeable.  Patient Saturations on Room Air at Rest = 91%  Patient Saturations on [employer] while Ambulating = 85%  Patient Saturations on 2 Liters of oxygen while Ambulating = 94%  Pt aware of results and agreeable to have family bring her home O2 tank for discharge.

## 2021-01-25 NOTE — Discharge Instructions (Signed)
IMPORTANT INFORMATION: PAY CLOSE ATTENTION  ? ?PHYSICIAN DISCHARGE INSTRUCTIONS ? ?Follow with Primary care provider  Hill, Gerald, MD  and other consultants as instructed by your Hospitalist Physician ? ?SEEK MEDICAL CARE OR RETURN TO EMERGENCY ROOM IF SYMPTOMS COME BACK, WORSEN OR NEW PROBLEM DEVELOPS  ? ?Please note: ?You were cared for by a hospitalist during your hospital stay. Every effort will be made to forward records to your primary care provider.  You can request that your primary care provider send for your hospital records if they have not received them.  Once you are discharged, your primary care physician will handle any further medical issues. Please note that NO REFILLS for any discharge medications will be authorized once you are discharged, as it is imperative that you return to your primary care physician (or establish a relationship with a primary care physician if you do not have one) for your post hospital discharge needs so that they can reassess your need for medications and monitor your lab values. ? ?Please get a complete blood count and chemistry panel checked by your Primary MD at your next visit, and again as instructed by your Primary MD. ? ?Get Medicines reviewed and adjusted: ?Please take all your medications with you for your next visit with your Primary MD ? ?Laboratory/radiological data: ?Please request your Primary MD to go over all hospital tests and procedure/radiological results at the follow up, please ask your primary care provider to get all Hospital records sent to his/her office. ? ?In some cases, they will be blood work, cultures and biopsy results pending at the time of your discharge. Please request that your primary care provider follow up on these results. ? ?If you are diabetic, please bring your blood sugar readings with you to your follow up appointment with primary care.   ? ?Please call and make your follow up appointments as soon as possible.   ? ?Also Note the  following: ?If you experience worsening of your admission symptoms, develop shortness of breath, life threatening emergency, suicidal or homicidal thoughts you must seek medical attention immediately by calling 911 or calling your MD immediately  if symptoms less severe. ? ?You must read complete instructions/literature along with all the possible adverse reactions/side effects for all the Medicines you take and that have been prescribed to you. Take any new Medicines after you have completely understood and accpet all the possible adverse reactions/side effects.  ? ?Do not drive when taking Pain medications or sleeping medications (Benzodiazepines) ? ?Do not take more than prescribed Pain, Sleep and Anxiety Medications. It is not advisable to combine anxiety,sleep and pain medications without talking with your primary care practitioner ? ?Special Instructions: If you have smoked or chewed Tobacco  in the last 2 yrs please stop smoking, stop any regular Alcohol  and or any Recreational drug use. ? ?Wear Seat belts while driving.  Do not drive if taking any narcotic, mind altering or controlled substances or recreational drugs or alcohol.  ? ? ? ? ? ?

## 2021-01-25 NOTE — Discharge Summary (Signed)
Physician Discharge Summary  Rita Jackson ZOX:[id number] DOB: 07-02-1955 DOA: 01/23/2021  PCP: Mirna Mires, MD  Admit date: 01/23/2021 Discharge date: 01/25/2021  Admitted From:  Home  Disposition:  Home   Recommendations for Outpatient Follow-up:  Follow up with PCP in 2 weeks  Discharge Condition: STABLE   CODE STATUS: FULL DIET: heart healthy    Brief Hospitalization Summary: Please see all hospital notes, images, labs for full details of the hospitalization. ADMISSION HPI: 65 year old African-American female with a history of type 2 diabetes, COPD, intermittent hypoxia, hypertension who presents to the ER today with a 3-day history of feeling bad at home.  Patient states symptoms started on Sunday.  She had some diarrhea.  She states that she had body aches all over.  Patient has been vaccinated with COVID with a booster.  She denies having any contact with any COVID-positive people.  Patient's been using her supplemental oxygen at home over the last 3 days.  She normally only uses as needed.  She has been feeling increasing short of breath.  Diarrhea stopped after 1 day.  She has been having a cough.    On arrival to the ER, the patient a low-grade temperature of 100.0.  Room air O2 saturations are 75%.  She started on 4 L of oxygen.  Chest x-ray demonstrates mild bilateral infiltrates.  COVID test was positive.   Due to her new onset hypoxia and COVID-pneumonia, [facility] contacted for admission.    ED Course: RA hypoxia down to 75%. Started on supplemental O2. Covid PCR positive. Started on decadron and remdesivir.  HOSPITAL COURSE:   Pt was admitted for Covid Pneumonia with increasing oxygen requirement from baseline. She was treated with IV remdesivir and IV steroids and supplemental oxygen and other supportive measures.   She seemed to respond well to these therapies and began asking to go home.   After 3 doses of remdesivir she felt well enough to go home.  She  reported that she has support at home.  She has oxygen at home that she uses intermittently.  She will discharge on oral decadron to complete full 10 day course and she can follow up with PCP in 2 weeks.  Quarantine at home was recommended and continue supportive measures and resting.  Pt verbalized understanding.  Pt advised to return or seek medical care if symptoms worsen or do not improve further.   Discharge Diagnoses:  Principal Problem:   Pneumonia due to COVID-19 virus Active Problems:   Essential hypertension   COPD (chronic obstructive pulmonary disease) (HCC)   Acute hypoxemic respiratory failure due to COVID-19 [facility])   Type 2 diabetes mellitus without complication [facility])   Discharge Instructions:  Allergies as of 01/25/2021   No Known Allergies      Medication List     STOP taking these medications    benzonatate 100 MG capsule Commonly known as: TESSALON       TAKE these medications    amLODipine 5 MG tablet Commonly known as: NORVASC Take 5 mg by mouth daily.   atorvastatin 20 MG tablet Commonly known as: LIPITOR Take 20 mg by mouth daily.   dexamethasone 4 MG tablet Commonly known as: DECADRON Take 1 tablet (4 mg total) by mouth daily with breakfast for 6 days. Start taking on: January 26, 2021   dextromethorphan 30 MG/5ML liquid Commonly known as: Delsym Take 5 mLs (30 mg total) by mouth 2 (two) times daily as needed for cough.   metFORMIN  500 MG tablet Commonly known as: Glucophage Take 1 tablet (500 mg total) by mouth 2 (two) times daily with a meal. What changed:  how much to take when to take this   OXYGEN Inhale 2-3 L into the lungs daily as needed (for shortness of breath).               Durable Medical Equipment  (From admission, onward)           Start     Ordered   01/25/21 1052  For home use only DME oxygen  Once       Question Answer Comment  Length of Need Lifetime   Mode or (Route) Nasal cannula   Liters per  Minute 3   Frequency Continuous (stationary and portable oxygen unit needed)   Oxygen conserving device Yes   Oxygen delivery system Gas      01/25/21 1051            Follow-up Information     Mirna Mires, MD. Schedule an appointment as soon as possible for a visit in 2 week(s).   Specialty: Family Medicine Why: Hospital Follow Up Contact information: [address] [place] [address] [phone]                No Known Allergies Allergies as of 01/25/2021   No Known Allergies      Medication List     STOP taking these medications    benzonatate 100 MG capsule Commonly known as: TESSALON       TAKE these medications    amLODipine 5 MG tablet Commonly known as: NORVASC Take 5 mg by mouth daily.   atorvastatin 20 MG tablet Commonly known as: LIPITOR Take 20 mg by mouth daily.   dexamethasone 4 MG tablet Commonly known as: DECADRON Take 1 tablet (4 mg total) by mouth daily with breakfast for 6 days. Start taking on: January 26, 2021   dextromethorphan 30 MG/5ML liquid Commonly known as: Delsym Take 5 mLs (30 mg total) by mouth 2 (two) times daily as needed for cough.   metFORMIN 500 MG tablet Commonly known as: Glucophage Take 1 tablet (500 mg total) by mouth 2 (two) times daily with a meal. What changed:  how much to take when to take this   OXYGEN Inhale 2-3 L into the lungs daily as needed (for shortness of breath).               Durable Medical Equipment  (From admission, onward)           Start     Ordered   01/25/21 1052  For home use only DME oxygen  Once       Question Answer Comment  Length of Need Lifetime   Mode or (Route) Nasal cannula   Liters per Minute 3   Frequency Continuous (stationary and portable oxygen unit needed)   Oxygen conserving device Yes   Oxygen delivery system Gas      01/25/21 1051            Procedures/Studies: DG Chest Port 1 View  Result Date: 01/23/2021 CLINICAL  DATA:  Weakness, cough, shortness of breath EXAM: PORTABLE CHEST 1 VIEW COMPARISON:  Chest radiograph 11/29/2020 FINDINGS: The heart is enlarged, unchanged. The mediastinal contours are stable. There are increased interstitial markings bilaterally, likely chronic. Linear opacities in the left base likely reflect subsegmental atelectasis. There is no focal consolidation or overt pulmonary edema. There is no  pleural effusion. There is no pneumothorax. There is no acute osseous abnormality. IMPRESSION: Stable cardiomegaly with left basilar atelectasis. No radiographic evidence of acute cardiopulmonary process. Electronically Signed   By: Lesia Hausen M.D.   On: 01/23/2021 17:29     Subjective: Pt reports that she is feeling much better today she is having no shortness of breath and no chest pain symptoms no wheezing or coughing.  She feels ready to go home today.  Discharge Exam: Vitals:   01/24/21 2119 01/25/21 0454  BP: 139/73 134/73  Pulse: (!) 59 (!) 51  Resp: 20 20  Temp: 98.5 F (36.9 C) (!) 97.5 F (36.4 C)  SpO2: 93% 94%   Vitals:   01/24/21 0430 01/24/21 1126 01/24/21 2119 01/25/21 0454  BP: (!) 113/57 127/72 139/73 134/73  Pulse: 60 62 (!) 59 (!) 51  Resp: 17 18 20 20   Temp: 97.8 F (36.6 C) 97.7 F (36.5 C) 98.5 F (36.9 C) (!) 97.5 F (36.4 C)  TempSrc: Oral Oral Oral Oral  SpO2: 96% 97% 93% 94%  Weight:      Height:       General: Pt is alert, awake, not in acute distress Cardiovascular: normal  S1/S2 +, no rubs, no gallops Respiratory: no wheezing, no rhonchi, no increased work of breathing, no rales.  Abdominal: Soft, NT, ND, bowel sounds + Extremities: no edema, no cyanosis   The results of significant diagnostics from this hospitalization (including imaging, microbiology, ancillary and laboratory) are listed below for reference.     Microbiology: Recent Results (from the past 240 hour(s))  Blood Culture (routine x 2)     Status: None (Preliminary result)    Collection Time: 01/23/21  4:27 PM   Specimen: BLOOD LEFT FOREARM  Result Value Ref Range Status   Specimen Description BLOOD LEFT FOREARM  Final   Special Requests   Final    BOTTLES DRAWN AEROBIC AND ANAEROBIC Blood Culture adequate volume   Culture   Final    NO GROWTH 2 DAYS Performed at [facility], [address] [place], [place]    Report Status PENDING  Incomplete  Blood Culture (routine x 2)     Status: None (Preliminary result)   Collection Time: 01/23/21  4:27 PM   Specimen: Right Antecubital; Blood  Result Value Ref Range Status   Specimen Description RIGHT ANTECUBITAL  Final   Special Requests AEROBIC BOTTLE ONLY Blood Culture adequate volume  Final   Culture   Final    NO GROWTH 2 DAYS Performed at [facility], [address], [place], [place]    Report Status PENDING  Incomplete  Resp Panel by RT-PCR (Flu A&B, Covid) Nasopharyngeal Swab     Status: Abnormal   Collection Time: 01/23/21  4:32 PM   Specimen: Nasopharyngeal Swab; Nasopharyngeal(NP) swabs in vial transport medium  Result Value Ref Range Status   SARS Coronavirus 2 by RT PCR POSITIVE (A) NEGATIVE Final    Comment: CRITICAL RESULT CALLED TO, READ BACK BY AND VERIFIED WITH: DR.GOLDSTIN AT 1833 ON 8.30.22 BY RUCINSKI,B (NOTE) SARS-CoV-2 target nucleic acids are DETECTED.  The SARS-CoV-2 RNA is generally detectable in upper respiratory specimens during the acute phase of infection. Positive results are indicative of the presence of the identified virus, but do not rule out bacterial infection or co-infection with other pathogens not detected by the test. Clinical correlation with patient history and other diagnostic information is necessary to determine patient infection status. The expected result is  Negative.  Fact Sheet for Patients: BloggerCourse.com  Fact Sheet for Healthcare Providers: SeriousBroker.it  This test is not yet  approved or cleared by the [place] FDA and  has been authorized for detection and/or diagnosis of SARS-CoV-2 by FDA under an Emergency Use Authorization (EUA).  This EUA will remain in effect (mean ing this test can be used) for the duration of  the COVID-19 declaration under Section 564(b)(1) of the Act, 21 U.S.C. section 360bbb-3(b)(1), unless the authorization is terminated or revoked sooner.     Influenza A by PCR NEGATIVE NEGATIVE Final   Influenza B by PCR NEGATIVE NEGATIVE Final    Comment: (NOTE) The Xpert Xpress SARS-CoV-2/FLU/RSV plus assay is intended as an aid in the diagnosis of influenza from Nasopharyngeal swab specimens and should not be used as a sole basis for treatment. Nasal washings and aspirates are unacceptable for Xpert Xpress SARS-CoV-2/FLU/RSV testing.  Fact Sheet for Patients: BloggerCourse.com  Fact Sheet for Healthcare Providers: SeriousBroker.it  This test is not yet approved or cleared by the [place] FDA and has been authorized for detection and/or diagnosis of SARS-CoV-2 by FDA under an Emergency Use Authorization (EUA). This EUA will remain in effect (meaning this test can be used) for the duration of the COVID-19 declaration under Section 564(b)(1) of the Act, 21 U.S.C. section 360bbb-3(b)(1), unless the authorization is terminated or revoked.  Performed at [facility], [address] [place], [place] [address]     Labs: BNP (last 3 results) No results for input(s): BNP in the last 8760 hours. Basic Metabolic Panel: Recent Labs  Lab 01/23/21 1627 01/24/21 0458  NA 135 139  K 3.7 3.9  CL 102 103  CO2 23 26  GLUCOSE 100* 147*  BUN 19 21  CREATININE 1.05* 0.81  CALCIUM 8.3* 8.5*   Liver Function Tests: Recent Labs  Lab 01/23/21 1627 01/24/21 0458  AST 13* 12*  ALT 11 10  ALKPHOS 69 61  BILITOT 0.5 0.3  PROT 8.2* 7.7  ALBUMIN 3.8 3.4*   No results for input(s):  LIPASE, AMYLASE in the last 168 hours. No results for input(s): AMMONIA in the last 168 hours. CBC: Recent Labs  Lab 01/23/21 1627 01/24/21 0458  WBC 5.9 3.8*  NEUTROABS 3.6 2.3  HGB 14.4 14.3  HCT 47.3* 46.6*  MCV 89.4 88.1  PLT 175 206   Cardiac Enzymes: No results for input(s): CKTOTAL, CKMB, CKMBINDEX, TROPONINI in the last 168 hours. BNP: Invalid input(s): POCBNP CBG: Recent Labs  Lab 01/23/21 2137 01/24/21 0710 01/24/21 1120 01/24/21 1623 01/25/21 0721  GLUCAP 115* 144* 181* 216* 139*   D-Dimer No results for input(s): DDIMER in the last 72 hours. Hgb A1c Recent Labs    01/23/21 1613 01/23/21 1627  HGBA1C 7.0* 7.0*   Lipid Profile No results for input(s): CHOL, HDL, LDLCALC, TRIG, CHOLHDL, LDLDIRECT in the last 72 hours. Thyroid function studies No results for input(s): TSH, T4TOTAL, T3FREE, THYROIDAB in the last 72 hours.  Invalid input(s): FREET3 Anemia work up Recent Labs    01/23/21 1626 01/24/21 0458  FERRITIN 100 98   Urinalysis    Component Value Date/Time   COLORURINE AMBER (A) 06/04/2020 1130   APPEARANCEUR CLOUDY (A) 06/04/2020 1130   LABSPEC 1.025 06/04/2020 1130   PHURINE 5.0 06/04/2020 1130   GLUCOSEU NEGATIVE 06/04/2020 1130   HGBUR NEGATIVE 06/04/2020 1130   BILIRUBINUR NEGATIVE 06/04/2020 1130   KETONESUR 20 (A) 06/04/2020 1130   PROTEINUR 30 (A) 06/04/2020 1130   NITRITE  NEGATIVE 06/04/2020 1130   LEUKOCYTESUR NEGATIVE 06/04/2020 1130   Sepsis Labs Invalid input(s): PROCALCITONIN,  WBC,  LACTICIDVEN Microbiology Recent Results (from the past 240 hour(s))  Blood Culture (routine x 2)     Status: None (Preliminary result)   Collection Time: 01/23/21  4:27 PM   Specimen: BLOOD LEFT FOREARM  Result Value Ref Range Status   Specimen Description BLOOD LEFT FOREARM  Final   Special Requests   Final    BOTTLES DRAWN AEROBIC AND ANAEROBIC Blood Culture adequate volume   Culture   Final    NO GROWTH 2 DAYS Performed at [facility], [address] [place], [place] [address]    Report Status PENDING  Incomplete  Blood Culture (routine x 2)     Status: None (Preliminary result)   Collection Time: 01/23/21  4:27 PM   Specimen: Right Antecubital; Blood  Result Value Ref Range Status   Specimen Description RIGHT ANTECUBITAL  Final   Special Requests AEROBIC BOTTLE ONLY Blood Culture adequate volume  Final   Culture   Final    NO GROWTH 2 DAYS Performed at [facility], [address] [place], [place] [address]    Report Status PENDING  Incomplete  Resp Panel by RT-PCR (Flu A&B, Covid) Nasopharyngeal Swab     Status: Abnormal   Collection Time: 01/23/21  4:32 PM   Specimen: Nasopharyngeal Swab; Nasopharyngeal(NP) swabs in vial transport medium  Result Value Ref Range Status   SARS Coronavirus 2 by RT PCR POSITIVE (A) NEGATIVE Final    Comment: CRITICAL RESULT CALLED TO, READ BACK BY AND VERIFIED WITH: DR.GOLDSTIN AT 1833 ON 8.30.22 BY RUCINSKI,B (NOTE) SARS-CoV-2 target nucleic acids are DETECTED.  The SARS-CoV-2 RNA is generally detectable in upper respiratory specimens during the acute phase of infection. Positive results are indicative of the presence of the identified virus, but do not rule out bacterial infection or co-infection with other pathogens not detected by the test. Clinical correlation with patient history and other diagnostic information is necessary to determine patient infection status. The expected result is Negative.  Fact Sheet for Patients: BloggerCourse.comhttps://www.fda.gov/media/152166/download  Fact Sheet for Healthcare Providers: SeriousBroker.ithttps://www.fda.gov/media/152162/download  This test is not yet approved or cleared by the [place] FDA and  has been authorized for detection and/or diagnosis of SARS-CoV-2 by FDA under an Emergency Use Authorization (EUA).  This EUA will remain in effect (mean ing this test can be used) for the duration of  the COVID-19 declaration under Section 564(b)(1) of  the Act, 21 U.S.C. section 360bbb-3(b)(1), unless the authorization is terminated or revoked sooner.     Influenza A by PCR NEGATIVE NEGATIVE Final   Influenza B by PCR NEGATIVE NEGATIVE Final    Comment: (NOTE) The Xpert Xpress SARS-CoV-2/FLU/RSV plus assay is intended as an aid in the diagnosis of influenza from Nasopharyngeal swab specimens and should not be used as a sole basis for treatment. Nasal washings and aspirates are unacceptable for Xpert Xpress SARS-CoV-2/FLU/RSV testing.  Fact Sheet for Patients: BloggerCourse.comhttps://www.fda.gov/media/152166/download  Fact Sheet for Healthcare Providers: SeriousBroker.ithttps://www.fda.gov/media/152162/download  This test is not yet approved or cleared by the [place] FDA and has been authorized for detection and/or diagnosis of SARS-CoV-2 by FDA under an Emergency Use Authorization (EUA). This EUA will remain in effect (meaning this test can be used) for the duration of the COVID-19 declaration under Section 564(b)(1) of the Act, 21 U.S.C. section 360bbb-3(b)(1), unless the authorization is terminated or revoked.  Performed at [facility], [address], [place], [place] [address]    Time coordinating discharge:  35 mins   SIGNED:  Standley Dakins, MD  [facility] 01/25/2021, 11:05 AM How to contact the [facility] Attending or Consulting provider 7A - 7P or covering provider during after hours 7P -7A, for this patient?  Check the care team in [facility] and look for a) attending/consulting [facility] provider listed and b) the [facility] team listed Log into [email or web address] and use [facility] universal password to access. If you do not have the password, please contact the hospital operator. Locate the [facility] provider you are looking for under [facility] and page to a number that you can be directly reached. If you still have difficulty reaching the provider, please page the [facility] (Director on Call) for the Hospitalists listed on amion for assistance.

## 2021-01-25 NOTE — Progress Notes (Signed)
Pt discharged via WC to POV with O2 tank from [facility] [facility].

## 2021-01-28 LAB — CULTURE, BLOOD (ROUTINE X 2)
Culture: NO GROWTH
Culture: NO GROWTH
Special Requests: ADEQUATE
Special Requests: ADEQUATE

## 2022-06-17 ENCOUNTER — Encounter (HOSPITAL_COMMUNITY): Payer: BC Managed Care – PPO

## 2022-06-20 ENCOUNTER — Encounter: Payer: BC Managed Care – PPO | Admitting: Vascular Surgery

## 2022-07-06 ENCOUNTER — Encounter: Payer: Self-pay | Admitting: Vascular Surgery

## 2022-08-31 DIAGNOSIS — E1169 Type 2 diabetes mellitus with other specified complication: Secondary | ICD-10-CM | POA: Diagnosis not present

## 2022-08-31 DIAGNOSIS — E7800 Pure hypercholesterolemia, unspecified: Secondary | ICD-10-CM | POA: Diagnosis not present

## 2022-08-31 DIAGNOSIS — J449 Chronic obstructive pulmonary disease, unspecified: Secondary | ICD-10-CM | POA: Diagnosis not present

## 2022-08-31 DIAGNOSIS — I10 Essential (primary) hypertension: Secondary | ICD-10-CM | POA: Diagnosis not present

## 2022-12-23 DIAGNOSIS — E785 Hyperlipidemia, unspecified: Secondary | ICD-10-CM | POA: Diagnosis not present

## 2022-12-23 DIAGNOSIS — J449 Chronic obstructive pulmonary disease, unspecified: Secondary | ICD-10-CM | POA: Diagnosis not present

## 2022-12-23 DIAGNOSIS — I10 Essential (primary) hypertension: Secondary | ICD-10-CM | POA: Diagnosis not present

## 2022-12-23 DIAGNOSIS — E7800 Pure hypercholesterolemia, unspecified: Secondary | ICD-10-CM | POA: Diagnosis not present

## 2022-12-23 DIAGNOSIS — B353 Tinea pedis: Secondary | ICD-10-CM | POA: Diagnosis not present

## 2022-12-23 DIAGNOSIS — B393 Disseminated histoplasmosis capsulati: Secondary | ICD-10-CM | POA: Diagnosis not present

## 2022-12-23 DIAGNOSIS — E1169 Type 2 diabetes mellitus with other specified complication: Secondary | ICD-10-CM | POA: Diagnosis not present

## 2022-12-23 DIAGNOSIS — L304 Erythema intertrigo: Secondary | ICD-10-CM | POA: Diagnosis not present

## 2023-04-22 DIAGNOSIS — I10 Essential (primary) hypertension: Secondary | ICD-10-CM | POA: Diagnosis not present

## 2023-04-22 DIAGNOSIS — B353 Tinea pedis: Secondary | ICD-10-CM | POA: Diagnosis not present

## 2023-04-22 DIAGNOSIS — J449 Chronic obstructive pulmonary disease, unspecified: Secondary | ICD-10-CM | POA: Diagnosis not present

## 2023-04-22 DIAGNOSIS — E1169 Type 2 diabetes mellitus with other specified complication: Secondary | ICD-10-CM | POA: Diagnosis not present

## 2023-04-22 DIAGNOSIS — Z9981 Dependence on supplemental oxygen: Secondary | ICD-10-CM | POA: Diagnosis not present
